# Patient Record
Sex: Female | Born: 1980 | Race: White | Hispanic: No | Marital: Married | Smoking: Never smoker
Health system: Southern US, Community
[De-identification: ages and names within clinical notes are randomized; demographics above are authoritative.]

## PROBLEM LIST (undated history)

## (undated) DIAGNOSIS — G43909 Migraine, unspecified, not intractable, without status migrainosus: Secondary | ICD-10-CM

## (undated) DIAGNOSIS — Z975 Presence of (intrauterine) contraceptive device: Secondary | ICD-10-CM

## (undated) DIAGNOSIS — Z9889 Other specified postprocedural states: Secondary | ICD-10-CM

## (undated) DIAGNOSIS — Z309 Encounter for contraceptive management, unspecified: Secondary | ICD-10-CM

## (undated) HISTORY — DX: Encounter for contraceptive management, unspecified: Z30.9

## (undated) HISTORY — PX: CHOLECYSTECTOMY: SHX55

## (undated) HISTORY — DX: Presence of (intrauterine) contraceptive device: Z97.5

---
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
## 2000-04-25 ENCOUNTER — Other Ambulatory Visit: Admission: RE | Admit: 2000-04-25 | Discharge: 2000-04-25 | Payer: Self-pay | Admitting: Gynecology

## 2001-07-14 ENCOUNTER — Other Ambulatory Visit: Admission: RE | Admit: 2001-07-14 | Discharge: 2001-07-14 | Payer: Self-pay | Admitting: Gynecology

## 2002-10-12 ENCOUNTER — Other Ambulatory Visit: Admission: RE | Admit: 2002-10-12 | Discharge: 2002-10-12 | Payer: Self-pay | Admitting: Gynecology

## 2003-01-22 ENCOUNTER — Inpatient Hospital Stay (HOSPITAL_COMMUNITY): Admission: AD | Admit: 2003-01-22 | Discharge: 2003-01-22 | Payer: Self-pay | Admitting: Gynecology

## 2003-04-05 ENCOUNTER — Inpatient Hospital Stay (HOSPITAL_COMMUNITY): Admission: AD | Admit: 2003-04-05 | Discharge: 2003-04-05 | Payer: Self-pay | Admitting: Gynecology

## 2003-05-27 ENCOUNTER — Inpatient Hospital Stay (HOSPITAL_COMMUNITY): Admission: AD | Admit: 2003-05-27 | Discharge: 2003-05-31 | Payer: Self-pay | Admitting: Gynecology

## 2003-06-01 ENCOUNTER — Encounter: Admission: RE | Admit: 2003-06-01 | Discharge: 2003-07-01 | Payer: Self-pay | Admitting: Gynecology

## 2003-07-02 ENCOUNTER — Encounter: Admission: RE | Admit: 2003-07-02 | Discharge: 2003-08-01 | Payer: Self-pay | Admitting: Gynecology

## 2003-07-15 ENCOUNTER — Other Ambulatory Visit: Admission: RE | Admit: 2003-07-15 | Discharge: 2003-07-15 | Payer: Self-pay | Admitting: Gynecology

## 2003-09-01 ENCOUNTER — Encounter: Admission: RE | Admit: 2003-09-01 | Discharge: 2003-10-01 | Payer: Self-pay | Admitting: Gynecology

## 2003-11-01 ENCOUNTER — Encounter: Admission: RE | Admit: 2003-11-01 | Discharge: 2003-12-01 | Payer: Self-pay | Admitting: Gynecology

## 2003-12-02 ENCOUNTER — Encounter: Admission: RE | Admit: 2003-12-02 | Discharge: 2004-01-01 | Payer: Self-pay | Admitting: Gynecology

## 2004-02-01 ENCOUNTER — Encounter: Admission: RE | Admit: 2004-02-01 | Discharge: 2004-03-02 | Payer: Self-pay | Admitting: Gynecology

## 2004-04-02 ENCOUNTER — Encounter: Admission: RE | Admit: 2004-04-02 | Discharge: 2004-05-01 | Payer: Self-pay | Admitting: Gynecology

## 2005-05-29 ENCOUNTER — Other Ambulatory Visit: Admission: RE | Admit: 2005-05-29 | Discharge: 2005-05-29 | Payer: Self-pay | Admitting: Gynecology

## 2005-10-03 ENCOUNTER — Inpatient Hospital Stay (HOSPITAL_COMMUNITY): Admission: AD | Admit: 2005-10-03 | Discharge: 2005-10-03 | Payer: Self-pay | Admitting: Gynecology

## 2005-11-26 ENCOUNTER — Inpatient Hospital Stay (HOSPITAL_COMMUNITY): Admission: AD | Admit: 2005-11-26 | Discharge: 2005-11-26 | Payer: Self-pay | Admitting: Gynecology

## 2005-12-12 ENCOUNTER — Inpatient Hospital Stay (HOSPITAL_COMMUNITY): Admission: RE | Admit: 2005-12-12 | Discharge: 2005-12-15 | Payer: Self-pay | Admitting: Gynecology

## 2006-01-30 ENCOUNTER — Other Ambulatory Visit: Admission: RE | Admit: 2006-01-30 | Discharge: 2006-01-30 | Payer: Self-pay | Admitting: Gynecology

## 2008-04-17 ENCOUNTER — Emergency Department (HOSPITAL_COMMUNITY): Admission: EM | Admit: 2008-04-17 | Discharge: 2008-04-17 | Payer: Self-pay | Admitting: Emergency Medicine

## 2008-04-18 ENCOUNTER — Ambulatory Visit (HOSPITAL_COMMUNITY): Admission: RE | Admit: 2008-04-18 | Discharge: 2008-04-18 | Payer: Self-pay | Admitting: Emergency Medicine

## 2009-04-08 ENCOUNTER — Ambulatory Visit (HOSPITAL_COMMUNITY): Admission: RE | Admit: 2009-04-08 | Discharge: 2009-04-08 | Payer: Self-pay | Admitting: General Surgery

## 2010-06-11 LAB — CBC
HCT: 39.3 % (ref 36.0–46.0)
Hemoglobin: 13.2 g/dL (ref 12.0–15.0)
MCHC: 33.6 g/dL (ref 30.0–36.0)
MCV: 90.1 fL (ref 78.0–100.0)
Platelets: 377 10*3/uL (ref 150–400)
RBC: 4.36 MIL/uL (ref 3.87–5.11)
RDW: 13.7 % (ref 11.5–15.5)
WBC: 9.8 10*3/uL (ref 4.0–10.5)

## 2010-06-11 LAB — HCG, QUANTITATIVE, PREGNANCY: hCG, Beta Chain, Quant, S: <2 m[IU]/mL (ref <5)

## 2010-06-11 LAB — BASIC METABOLIC PANEL
BUN: 18 mg/dL (ref 6–23)
CO2: 28 mEq/L (ref 19–32)
Calcium: 9.8 mg/dL (ref 8.4–10.5)
Chloride: 104 mEq/L (ref 96–112)
Creatinine, Ser: 0.62 mg/dL (ref 0.4–1.2)
GFR calc Af Amer: >60 mL/min (ref >60)
GFR calc non Af Amer: >60 mL/min (ref >60)
Glucose, Bld: 84 mg/dL (ref 70–99)
Potassium: 4 mEq/L (ref 3.5–5.1)
Sodium: 137 mEq/L (ref 135–145)

## 2010-07-10 LAB — CBC
HCT: 37.3 % (ref 36.0–46.0)
Hemoglobin: 12.3 g/dL (ref 12.0–15.0)
MCHC: 32.9 g/dL (ref 30.0–36.0)
MCV: 88 fL (ref 78.0–100.0)
Platelets: 290 10*3/uL (ref 150–400)
RBC: 4.24 MIL/uL (ref 3.87–5.11)
RDW: 13.4 % (ref 11.5–15.5)
WBC: 8.7 10*3/uL (ref 4.0–10.5)

## 2010-07-10 LAB — COMPREHENSIVE METABOLIC PANEL
ALT: 12 U/L (ref 0–35)
AST: 14 U/L (ref 0–37)
Albumin: 3.5 g/dL (ref 3.5–5.2)
Alkaline Phosphatase: 39 U/L (ref 39–117)
BUN: 10 mg/dL (ref 6–23)
CO2: 27 mEq/L (ref 19–32)
Calcium: 8.6 mg/dL (ref 8.4–10.5)
Chloride: 105 mEq/L (ref 96–112)
Creatinine, Ser: 0.6 mg/dL (ref 0.4–1.2)
GFR calc Af Amer: >60 mL/min (ref >60)
GFR calc non Af Amer: >60 mL/min (ref >60)
Glucose, Bld: 94 mg/dL (ref 70–99)
Potassium: 3.3 mEq/L — ABNORMAL LOW (ref 3.5–5.1)
Sodium: 140 mEq/L (ref 135–145)
Total Bilirubin: 0.2 mg/dL — ABNORMAL LOW (ref 0.3–1.2)
Total Protein: 6.3 g/dL (ref 6.0–8.3)

## 2010-07-10 LAB — URINALYSIS, ROUTINE W REFLEX MICROSCOPIC
Bilirubin Urine: NEGATIVE
Glucose, UA: NEGATIVE mg/dL
Hgb urine dipstick: NEGATIVE
Nitrite: NEGATIVE
Protein, ur: NEGATIVE mg/dL
Specific Gravity, Urine: 1.015 (ref 1.005–1.030)
Urobilinogen, UA: 0.2 mg/dL (ref 0.0–1.0)
pH: 7.5 (ref 5.0–8.0)

## 2010-07-10 LAB — DIFFERENTIAL
Basophils Absolute: 0 10*3/uL (ref 0.0–0.1)
Basophils Relative: 1 % (ref 0–1)
Eosinophils Absolute: 0.2 10*3/uL (ref 0.0–0.7)
Eosinophils Relative: 2 % (ref 0–5)
Lymphocytes Relative: 27 % (ref 12–46)
Lymphs Abs: 2.3 10*3/uL (ref 0.7–4.0)
Monocytes Absolute: 0.7 10*3/uL (ref 0.1–1.0)
Monocytes Relative: 9 % (ref 3–12)
Neutro Abs: 5.4 10*3/uL (ref 1.7–7.7)
Neutrophils Relative %: 62 % (ref 43–77)

## 2010-07-10 LAB — AMYLASE: Amylase: 44 U/L (ref 27–131)

## 2010-07-10 LAB — LIPASE, BLOOD: Lipase: 24 U/L (ref 11–59)

## 2010-07-10 LAB — PREGNANCY, URINE: Preg Test, Ur: NEGATIVE

## 2010-08-11 NOTE — Discharge Summary (Signed)
NAME:  Carrie Arias, Carrie Arias                           ACCOUNT NO.:  [id number]   MEDICAL RECORD NO.:  [id number]                   PATIENT TYPE:  INP   LOCATION:  9127                                 FACILITY:  [facility]   PHYSICIAN:  Juan H. Lily Peer, M.D.             DATE OF BIRTH:  May 10, 1980   DATE OF ADMISSION:  05/27/2003  DATE OF DISCHARGE:  05/31/2003                                 DISCHARGE SUMMARY   DISCHARGE DIAGNOSES:  1. Intrauterine pregnancy 41+ weeks, delivered.  2. Arrest of first stage of labor, suspect cephalopelvic disproportion.  3. Status post primary lower uterine segment transverse cesarean section by     Dr. Reynaldo Minium on May 28, 2003.   HISTORY:  This is a 22-years-of-age female gravida 1 para 0 whose prenatal  course was uncomplicated.   HOSPITAL COURSE:  On May 27, 2003 the patient was admitted at 41+ weeks for  induction, was given Cervidil, and in the a.m. of May 28, 2003 was begun on  Pitocin.  The patient was diagnosed with arrest of the first stage of labor,  suspect cephalopelvic disproportion, and therefore underwent a primary lower  uterine segment transverse cesarean section by Dr. Reynaldo Minium on May 28, 2003.  Underwent delivery of a female, Apgars of 9 and 9, weight of 8  pounds 9 ounces.  There was noted to be no complications.  Postpartum the  patient remained afebrile, voiding, in stable condition, and she was  discharged to home on May 31, 2003 and given [facility]  postpartum instructions and postpartum booklet.   ACCESSORY CLINICAL FINDINGS/LABORATORY DATA:  The patient is A positive,  rubella immune.  On May 29, 2003 hemoglobin was 9.5.   DISPOSITION:  The patient is discharged to home, informed to return to the  office in 6 weeks, if had any problem prior to that time to be seen in the  office, given a prescription for Percocet p.r.n. pain.     Susa Loffler, P.A.                    Juan H. Lily Peer, M.D.    TSG/MEDQ  D:  07/12/2003  T:  07/13/2003  Job:  [id number]

## 2010-08-11 NOTE — Op Note (Signed)
[name], Carrie Arias                 ACCOUNT NO.:  [id number]   MEDICAL RECORD NO.:  [id number]          PATIENT TYPE:  INP   LOCATION:  9115                          [facility]ACILITY:  [facility]   PHYSICIAN:  Juan H. Lily Peer, M.D.DATE OF BIRTH:  Apr 26, 1980   DATE OF PROCEDURE:  12/12/2005  DATE OF DISCHARGE:                                 OPERATIVE REPORT   SURGEON:  Juan H. Lily Peer, M.D.   FIRST ASSISTANT:  Ivor Costa. Farrel Gobble, M.D.   INDICATION FOR OPERATION:  A 30 year old gravida 2, para 1, with history of  prior cesarean section requesting elective repeat.  She is currently 38-5/[redacted]  weeks gestation.   PREOPERATIVE DIAGNOSES:  1. Term intrauterine pregnancy.  2. Previous lower uterine segment transverse cesarean section, requesting      elective repeat.   POSTOPERATIVE DIAGNOSES:  1. Term intrauterine pregnancy.  2. Previous lower uterine segment transverse cesarean section, requesting      elective repeat.   ANESTHESIA:  Spinal.   PROCEDURE PERFORMED:  Repeat lower uterine segment transverse cesarean  section.   FINDINGS:  Viable female infant, Apgars are 9 and 9, with a weight of 7  pounds, 7 ounces.  Normal maternal pelvic anatomy.   DESCRIPTION OF OPERATION:  After the patient was adequately counseled she  underwent a successful spinal anesthesia.  The abdomen was prepped and  draped in the usual sterile fashion.  A Foley catheter was inserted in  effort to monitor urinary output.  Marcaine 0.25% had been infiltrated in  the planned Pfannenstiel incision site.  Incision was carried out from the  skin, down to the subcutaneous tissue, down through the rectus fascia.  __________ nick was made, the fascia was incised in a transverse fashion.  The peritoneal cavity was entered cautiously.  The bladder flap was  established.  The lower uterine segment was incised in a transverse fashion.  Clear amniotic fluid was present.  Newborn's head was delivered after a  manual reduction of  the nuchal cord.  The nasopharyngeal air was bulb  suctioned.  The newborn was delivered, the cord was doubly clamped and  excised, shown to the parents and passed off to the neonatologist who  carried out the above-mentioned parameters.  After cord blood was obtained,  the placenta and cord was passed off the operative field.  The patient was  donating her cord blood to the __________ Blood Bank.  Ancef 1 g was  administered intravenously.  The uterus exteriorized.  The intrauterine  cavity was swept clear of remaining products of conception.  The lower  uterine segment and transverse incision was closed in a locking stitch  manner, first with a #0 Vicryl suture followed by a second layer in  imbricating fashion with a similar suture material.  There was normal  maternal pelvic anatomy.  The uterus was placed back in the pelvic cavity.  The visceral peritoneum was not reapproximated but the rectus fascia was  closed with a running stitch of #0 Vicryl suture.  Subcutaneous bleeders  were Bovie cauterized.  The skin was  reapproximated with skin clips  followed by placing Xeroform gauze and a 4 x  8 dressing.  Patient was transferred to the recovery room with stable vital  signs.  Blood loss was reported at 700 mL.  IV fluids 3 liters of lactated  Ringer's and urine output was 200 mL and clear.      Juan H. Lily Peer, M.D.  Electronically Signed     JHF/MEDQ  D:  12/12/2005  T:  12/13/2005  Job:  [id number]

## 2010-08-11 NOTE — Discharge Summary (Signed)
[name], Carrie Arias                 ACCOUNT NO.:  [id number]   MEDICAL RECORD NO.:  [id number]          PATIENT TYPE:  INP   LOCATION:  9115                          [facility]ILITY:  [facility]   PHYSICIAN:  Ivor Costa. Farrel Gobble, M.D. DATE OF BIRTH:  Aug 21, 1980   DATE OF ADMISSION:  12/12/2005  DATE OF DISCHARGE:  12/15/2005                                 DISCHARGE SUMMARY   PRINCIPAL DIAGNOSIS:  Term pregnancy, prior cesarean section.   PRINCIPAL PROCEDURE:  Elective repeat cesarean section.   HOSPITAL COURSE:  Refer to the dictated H&P. The patient presented on the  morning of September 19 and underwent elective repeat cesarean section done  by Dr. Lily Peer under spinal anesthesia for delivery of a viable female  with Apgar's of 9 and 9, birth weight 7.7, normal uterus, tubes and ovaries.  The patient was transferred to the recovery room and the postpartum floor in  due fashion. Her postpartum course was unremarkable. She remained afebrile  and her vitals stable throughout. She was tolerating a regular diet and oral  analgesics at the time of discharge. Her postoperative exam, her abdomen was  soft nontender. Her uterus was below the umbilicus and nontender. Her  incision was closed with staples and was poorly approximated in the mid  portion without any signs or symptoms of infection. No induration or warmth.  The extremities were nontender.   ASSESSMENT:  Status post elective repeat cesarean section. She was  discharged home in stable condition. She was given a prescription for  Percocet 5 one to two q.4h p.r.n. pain, #40. She will mix that with over-the-  counter Motrin. She will return back to the office on Monday for staple  removal.      Ivor Costa. Farrel Gobble, M.D.  Electronically Signed     THL/MEDQ  D:  12/15/2005  T:  12/18/2005  Job:  [id number]

## 2010-08-11 NOTE — Op Note (Signed)
NAME:  Carrie Arias, Carrie Arias                           ACCOUNT NO.:  [id number]   MEDICAL RECORD NO.:  [id number]                   PATIENT TYPE:  INP   LOCATION:  9127                                 [facility]   PHYSICIAN:  Juan H. Lily Peer, M.D.             DATE OF BIRTH:  07/15/1980   DATE OF PROCEDURE:  05/28/2003  DATE OF DISCHARGE:                                 OPERATIVE REPORT   PREOPERATIVE DIAGNOSES:  1. Term intrauterine pregnancy.  2. Arrest of first stage of labor, suspect cephalopelvic disproportion.   POSTOPERATIVE DIAGNOSES:  1. Term intrauterine pregnancy.  2. Arrest of first stage of labor, suspect cephalopelvic disproportion.   ANESTHESIA:  Epidural.   SURGEON:  Juan H. Lily Peer, M.D.   PROCEDURE PERFORMED:  Primary lower uterine segment transverse cesarean  section.   INDICATION FOR OPERATION:  A 30 year old gravida 1, para 0, at 41-1/2 weeks'  estimated gestational age, with arrest of first stage of labor.  Suspect  cephalopelvic disproportion.   FINDINGS:  A viable female infant, Apgars of 9 and 9, with an arterial cord  pH of 7.31 and a weight of 8 pounds 9 ounces.  Normal clear amniotic fluid.  Normal maternal pelvic anatomy.   DESCRIPTION OF OPERATION:  After the patient was adequately counseled, she  was taken to the operating room, where she underwent redosing of her  epidural.  The abdomen was prepped and draped in the usual sterile fashion.  A Foley catheter was in place.  A Pfannenstiel skin incision was made 2 cm  above the symphysis pubis.  The incision was carried down from the skin down  through the subcutaneous tissue to the fascia, whereby a midline nick was  made.  The fascia was incised in a transverse fashion.  The peritoneal  cavity was entered cautiously and the bladder flap was established.  The  lower uterine segment was incised in a transverse fashion, clear amniotic  fluid was present.  The newborn was delivered without any  complication, and  the nasopharyngeal area was bulb-suctioned.  The cord was doubly clamped and  excised.  The newborn gave an immediate cry, was shown to the parents and  passed off to the pediatricians who were in attendance, who gave the above-  mentioned parameters.  After cord blood was obtained, the placenta was  delivered from the intrauterine cavity.  The uterus was exteriorized and the  intrauterine cavity was swept clean of remaining products of conception.  The lower uterine transverse incision was closed with a running locking  stitch of 0 Vicryl suture.  The uterus was placed back in the pelvic cavity.  The pelvic cavity was copiously irrigated with normal saline solution.  After ascertaining adequate hemostasis, the fascia was closed.  The visceral  peritoneum was not closed.  The rectus fascia was closed with a running  stitch of 0 Vicryl suture.  Subcutaneous bleeders were  Bovie cauterized.  The skin was reapproximated with skin clips,  followed by placement of Xeroform gauze and 4 x 8 dressing.  The patient was  transferred to the recovery room with stable vital signs.  Blood loss from  the procedure was 700 mL.  Urine output was 125 mL, and IV fluids pending at  the time of this dictation.                                               Juan H. Lily Peer, M.D.    JHF/MEDQ  D:  05/28/2003  T:  05/29/2003  Job:  [id number]

## 2010-08-11 NOTE — H&P (Signed)
Carrie Arias, Carrie Arias                 ACCOUNT NO.:  [id number]   MEDICAL RECORD NO.:  [id number]          PATIENT TYPE:  INP   LOCATION:  NA                            [facility]  [facility]   PHYSICIAN:  Juan H. Lily Peer, M.D.DATE OF BIRTH:  16-Nov-1980   DATE OF ADMISSION:  DATE OF DISCHARGE:                                HISTORY & PHYSICAL   CHIEF COMPLAINT:  1. Term intrauterine pregnancy at 38-5/7-weeks' gestation.  2. Prior cesarean section, requesting elective repeat.  3. Varicella infection, second trimester.   HISTORY:  The patient is a 30 year old gravida 2, para 1 with last menstrual  period March 16, 2005. Estimated date of confinement is December 20, 2005. The patient will be 38-5/7-weeks' gestation at time of requested  elective repeat cesarean section. Patient with prior cesarean section  secondary to arrest of first stage of labor. The patient in this pregnancy  had declined first trimester screening and cystic fibrosis screening and  alpha fetoprotein. The patient had requested elective repeat cesarean  section. She had been offered a trial of labor. At approximately 29-1/2-  weeks' gestation, the patient had developed a rash on her chest and anterior  abdominal wall. Her daughter had been diagnosed with chicken pox, and within  a 96-hour period, the patient had received at [facility] intravenous  immunoglobulin, and subsequently, the symptoms had resolved. The patient was  followed with antepartum testing consisting of ultrasounds with good  interval growth and reassuring fetal heart tracing. Her group B strep  culture was negative.   PAST MEDICAL HISTORY:  1. The patient had prior cesarean section secondary to arrest of first      stage of labor.  2. Varicella infection second trimester, received IVIG on October 03, 2005.   The patient denies any allergies. On prenatal vitamins.   REVIEW OF SYSTEMS:  See hospital form.   PHYSICAL EXAMINATION:  VITAL SIGNS:   Blood pressure 122/78. Urine was  negative for protein and glucose. Weight 187 pounds.  HEENT:  Unremarkable.  NECK:  Supple. Trachea midline. No carotid bruits. No thyromegaly.  LUNGS:  Clear to auscultation without rhonchi or wheezes.  HEART:  Regular rate and rhythm. No murmurs or gallops.  BREASTS:  Not done.  ABDOMEN:  Gravida uterus. Vertex presentation by __________ maneuver.  Pfannenstiel scar was evident. Cervix was long, closed and posterior.  EXTREMITIES:  DTRs 1+. Negative clonus.   PRENATAL LABORATORY DATA:  A positive blood type. Negative antibody screen.  VDRL nonreactive. Rubella immune. Hepatitis B surface antigen and HIV were  negative. Diabetes screen was normal. The patient declined cystic fibrosis  screening, declined alpha fetoprotein testing and declined first trimester  screening, and GBS culture was negative.   ASSESSMENT:  A 30 year old gravida 2, para 1 at 38-5/7-weeks' gestation at  time of requested elective repeat cesarean section. Risks, benefits, pros  and cons of the operation were discussed with the patient. All questions  were answered and will follow accordingly.   PLAN:  The patient is scheduled for a repeat cesarean section Wednesday,  September 19 at 8:30  a.m. at [facility]. Please have history and  physical available.      Juan H. Lily Peer, M.D.  Electronically Signed     JHF/MEDQ  D:  12/11/2005  T:  12/11/2005  Job:  [id number]

## 2010-08-11 NOTE — H&P (Signed)
NAMEDARWIN, GUASTELLA                 ACCOUNT NO.:  [id number]   MEDICAL RECORD NO.:  [id number]          PATIENT TYPE:  AMB   LOCATION:  DAY                           FACILITY:  [facility]   PHYSICIAN:  Tilford Pillar, MD      DATE OF BIRTH:  06/07/1980   DATE OF ADMISSION:  DATE OF DISCHARGE:  LH                              HISTORY & PHYSICAL   CHIEF COMPLAINT:  Gallstones.   HISTORY OF PRESENT ILLNESS:  The patient is a 30 year old female who  presents to my office on referral from [facility] with a  history of right-sided abdominal pain.  She states that the pain does  radiate to the right-sided flank as well as to her right back.  She does  have occasional nausea, but denies any emesis.  She has had no change in  her bowel movements.  No melena or hematochezia.  She states the pain  has been intermittent over the last 2 years, but has increased over the  last several weeks.  She has had no fever or chills.  No history of  jaundice.  She does state that she has noted some change with diet and  did state her last episode did seem to get worse after having a portion  of lasagna.   PAST MEDICAL HISTORY:  None.   PAST SURGICAL HISTORY:  Cyst in the left breast and a cesarean section.   MEDICATIONS:  None.   ALLERGIES:  No known drug allergies.   SOCIAL HISTORY:  No tobacco, no alcohol use.  Occupation is account.  She has had 2 prior pregnancies.   PERTINENT FAMILY HISTORY:  She does have a grandmother, who has had a  history of biliary disease.  She denies any Native American ancestry.   REVIEW OF SYSTEMS:  CONSTITUTIONAL:  Unremarkable.  EYES:  Unremarkable.  EARS, NOSE, and THROAT:  Unremarkable.  RESPIRATORY:  Unremarkable.  CARDIOVASCULAR:  Unremarkable.  GASTROINTESTINAL:  Abdominal pain as per  HPI.  GENITOURINARY:  Unremarkable.  MUSCULOSKELETAL:  Arthralgias of  the back.  SKIN:  Unremarkable.  ENDOCRINE:  Unremarkable.  NEURO:  Unremarkable.   PHYSICAL  EXAMINATION:  GENERAL:  The patient is mildly obese.  She is  calm.  She denied any acute distress.  She is alert and oriented x3.  HEENT:  Scalp, no deformities.  No masses.  Eyes, pupils equal, round,  and reactive to light.  Extraocular movements are intact.  No scleral  icterus or conjunctival pallor is noted.  Oral mucosa is pink.  Normal  occlusion.  NECK:  Trachea is midline.  No thyroid nodularity.  No cervical  lymphadenopathy.  PULMONARY:  Unlabored respiration.  No wheezes.  No crackles.  She is  clear to auscultation bilaterally.  CARDIOVASCULAR:  Regular rate and rhythm.  No murmurs or gallops.  She  has 2+ radial and dorsalis pedis pulses bilaterally.  ABDOMEN:  Positive bowel sounds.  Abdomen is soft.  She does have some  tenderness in the right upper quadrant.  She does have positive Murphy  sign.  No hernias.  No masses are apparent.  SKIN:  Warm and dry.   PERTINENT LABORATORY AND RADIOGRAPHIC STUDIES:  Right upper quadrant  ultrasound demonstrates multiple gallstones.  She does not demonstrate  any ductal dilatation.  Her liver panel is within normal limits.   ASSESSMENT AND PLAN:  Cholelithiasis.  At this time, I had a long  discussion with the patient regarding the risks, benefits, and  alternatives of the laparoscopic possible open cholecystectomy  including, but not limited to the risk of bleeding, infection, bile  leak, small-bowel injury, common bile duct injury, as well as  possibility of intraoperative cardiac and pulmonary events.  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  We will plan to  schedule this at her earliest convenience.  I did discuss in the interim  the patient should avoid any fatty greasy foods and to call us should  her symptomatology worsen.      Tilford Pillar, MD   Electronically Signed     BZ/MEDQ  D:  06/30/2008  T:  07/01/2008  Job:  [id number]   cc:   Short-Stay Surgery   [facility]

## 2012-12-01 ENCOUNTER — Telehealth: Payer: Self-pay | Admitting: Family Medicine

## 2012-12-01 MED ORDER — BENZONATATE 100 MG PO CAPS
100.0000 mg | ORAL_CAPSULE | Freq: Three times a day (TID) | ORAL | Status: DC | PRN
Start: 2012-12-01 — End: 2013-04-02

## 2012-12-01 NOTE — Telephone Encounter (Signed)
More than likely a virus , if Sx beyopnd 7 days or fevers/ SOB/severe Ha etc then NTBS, otherwise let run its course

## 2012-12-01 NOTE — Telephone Encounter (Signed)
Sent in medication to [facility]. Patient was notified.

## 2012-12-01 NOTE — Telephone Encounter (Signed)
Tessalon 100 mg one tid prn , #21

## 2012-12-01 NOTE — Telephone Encounter (Signed)
Patient has a cough, did have a fever and cold chills about 3 or 4 days ago but that is now gone. She is wondering if we can call her something in due to her not having insurance. I explained to her that we usually require an office visit for that, but she still wanted me to send back a message to try.  [facility]

## 2012-12-01 NOTE — Telephone Encounter (Signed)
Notified patient more than likely a virus , if Sx beyopnd 7 days or fevers/ SOB/severe Ha etc then NTBS, otherwise let run its course. Patient verbalized understanding. Patient wants to know if something can be called in for her cough?

## 2013-04-02 ENCOUNTER — Ambulatory Visit (INDEPENDENT_AMBULATORY_CARE_PROVIDER_SITE_OTHER): Payer: BC Managed Care – PPO | Admitting: Family Medicine

## 2013-04-02 ENCOUNTER — Encounter: Payer: Self-pay | Admitting: Family Medicine

## 2013-04-02 VITALS — BP 118/64 | Temp 101.5°F | Ht 63.0 in | Wt 185.0 lb

## 2013-04-02 DIAGNOSIS — J111 Influenza due to unidentified influenza virus with other respiratory manifestations: Secondary | ICD-10-CM

## 2013-04-02 MED ORDER — OSELTAMIVIR PHOSPHATE 75 MG PO CAPS: 75.0000 mg | ORAL_CAPSULE | Freq: Two times a day (BID) | ORAL | Status: DC

## 2013-04-02 NOTE — Progress Notes (Signed)
   Subjective:    Patient ID: Carrie Arias, female    DOB: 08/16/1980, 33 y.o.   MRN: [id number]  Cough This is a new problem. The current episode started in the past 7 days. Associated symptoms include ear pain, a fever, headaches, myalgias, nasal congestion, a sore throat and shortness of breath.    tue started , eve develoed fever achey nes  Painful ear eyes hurt  Burning nasal disch  Bad cough   achey i the gum, other sick at work  Using nyquil and ibuprofen  dayquil  Felt temp up and felt bad   Review of Systems  Constitutional: Positive for fever.  HENT: Positive for ear pain and sore throat.   Respiratory: Positive for cough and shortness of breath.   Musculoskeletal: Positive for myalgias.  Neurological: Positive for headaches.       Objective:   Physical Exam  Alert moderate malaise. Temperature 101.5 Fahrenheit. H&T frontal congestion and fullness.  Lungs clear with intermittent deep cough. Heart rare rhythm. Hydration good      Assessment & Plan:  Impression probable flu. Sudden onset. Classic symptoms. Plan warning signs discussed. Tamiflu twice a day 5 days. Symptomatic care discussed. WSL

## 2013-10-16 ENCOUNTER — Emergency Department (HOSPITAL_COMMUNITY): Payer: BC Managed Care – PPO

## 2013-10-16 ENCOUNTER — Emergency Department (HOSPITAL_COMMUNITY)
Admission: EM | Admit: 2013-10-16 | Discharge: 2013-10-16 | Disposition: A | Discharge status: Home / Self Care | Payer: BC Managed Care – PPO | Attending: Emergency Medicine | Admitting: Emergency Medicine

## 2013-10-16 ENCOUNTER — Encounter (HOSPITAL_COMMUNITY): Payer: Self-pay | Admitting: Emergency Medicine

## 2013-10-16 DIAGNOSIS — Z792 Long term (current) use of antibiotics: Secondary | ICD-10-CM | POA: Insufficient documentation

## 2013-10-16 DIAGNOSIS — L03211 Cellulitis of face: Secondary | ICD-10-CM

## 2013-10-16 DIAGNOSIS — K029 Dental caries, unspecified: Secondary | ICD-10-CM | POA: Insufficient documentation

## 2013-10-16 DIAGNOSIS — L0201 Cutaneous abscess of face: Secondary | ICD-10-CM | POA: Insufficient documentation

## 2013-10-16 DIAGNOSIS — R221 Localized swelling, mass and lump, neck: Secondary | ICD-10-CM

## 2013-10-16 DIAGNOSIS — Z3202 Encounter for pregnancy test, result negative: Secondary | ICD-10-CM | POA: Insufficient documentation

## 2013-10-16 DIAGNOSIS — R220 Localized swelling, mass and lump, head: Secondary | ICD-10-CM | POA: Insufficient documentation

## 2013-10-16 LAB — POC URINE PREG, ED: Preg Test, Ur: NEGATIVE

## 2013-10-16 MED ORDER — MORPHINE SULFATE 4 MG/ML IJ SOLN
4.0000 mg | Freq: Once | INTRAMUSCULAR | Status: AC
  Administered 2013-10-16: 4 mg via INTRAVENOUS
  Filled 2013-10-16: qty 1

## 2013-10-16 MED ORDER — HYDROCODONE-ACETAMINOPHEN 5-325 MG PO TABS: 1.0000 | ORAL_TABLET | Freq: Four times a day (QID) | ORAL | Status: DC | PRN

## 2013-10-16 MED ORDER — IOHEXOL 300 MG/ML  SOLN
100.0000 mL | Freq: Once | INTRAMUSCULAR | Status: AC | PRN
  Administered 2013-10-16: 100 mL via INTRAVENOUS

## 2013-10-16 NOTE — ED Notes (Signed)
Dr. Gordy LevanWalton, resident, at bedside

## 2013-10-16 NOTE — Discharge Instructions (Signed)

## 2013-10-16 NOTE — ED Provider Notes (Signed)
CSN: [id number]     Arrival date & time 10/16/13  1459 History   First MD Initiated Contact with Patient 10/16/13 1622     Chief Complaint  Patient presents with  . Facial Swelling     (Consider location/radiation/quality/duration/timing/severity/associated sxs/prior Treatment) Patient is a 33 y.o. female presenting with rash.  Rash Location:  Face Quality: redness and swelling   Severity:  Mild Onset quality:  Gradual Duration:  4 days Timing:  Constant Progression:  Worsening Chronicity:  New Context comment:  Dental procedure Relieved by:  Nothing Associated symptoms: no abdominal pain, no diarrhea, no fever, no headaches, no nausea, no shortness of breath, no sore throat and not vomiting     History reviewed. No pertinent past medical history. Past Surgical History  Procedure Laterality Date  . Cholecystectomy     History reviewed. No pertinent family history. History  Substance Use Topics  . Smoking status: Never Smoker   . Smokeless tobacco: Not on file  . Alcohol Use: No   OB History   Grav Para Term Preterm Abortions TAB SAB Ect Mult Living                 Review of Systems  Constitutional: Negative for fever and chills.  HENT: Negative for sore throat.   Eyes: Negative for pain.  Respiratory: Negative for cough and shortness of breath.   Cardiovascular: Negative for chest pain.  Gastrointestinal: Negative for nausea, vomiting, abdominal pain and diarrhea.  Genitourinary: Negative for dysuria.  Musculoskeletal: Negative for back pain.  Skin: Positive for rash.  Neurological: Negative for numbness and headaches.      Allergies  Review of patient's allergies indicates no known allergies.  Home Medications   Prior to Admission medications   Medication Sig Start Date End Date Taking? Authorizing Provider  clindamycin (CLEOCIN) 300 MG capsule Take 300 mg by mouth 4 (four) times daily. Started course of medication on 10-13-13 take until all gone (40  capsules in bottle)   Yes Historical Provider, MD  traMADol-acetaminophen (ULTRACET) 37.5-325 MG per tablet Take 1 tablet by mouth every 6 (six) hours as needed for moderate pain.   Yes Historical Provider, MD  HYDROcodone-acetaminophen (NORCO/VICODIN) 5-325 MG per tablet Take 1 tablet by mouth every 6 (six) hours as needed for severe pain. 10/16/13   Imagene Sheller, MD   BP 128/72  Pulse 79  Temp(Src) 98.5 F (36.9 C) (Oral)  Resp 18  SpO2 95% Physical Exam  Constitutional: She is oriented to person, place, and time. She appears well-developed and well-nourished. No distress.  HENT:  Head: Normocephalic and atraumatic.  Mouth/Throat: Dental caries present.  Swelling / redness to L cheek / lips.   Eyes: Pupils are equal, round, and reactive to light. Right eye exhibits no discharge. Left eye exhibits no discharge.  Neck: Normal range of motion.  Cardiovascular: Normal rate, regular rhythm and normal heart sounds.   Pulmonary/Chest: Effort normal and breath sounds normal.  Abdominal: Soft. She exhibits no distension. There is no tenderness.  Musculoskeletal: Normal range of motion.  Neurological: She is alert and oriented to person, place, and time.  Skin: Skin is warm. She is not diaphoretic.    ED Course  Procedures (including critical care time) Labs Review Labs Reviewed  POC URINE PREG, ED    Imaging Review Ct Maxillofacial W/cm  10/16/2013   CLINICAL DATA:  Recent root canal and significant left facial swelling. The history recorded by the technologist incorrect. Then root canals on the  left.  EXAM: CT MAXILLOFACIAL WITH CONTRAST  TECHNIQUE: Multidetector CT imaging of the maxillofacial structures was performed with intravenous contrast. Multiplanar CT image reconstructions were also generated. A small metallic BB was placed on the right temple in order to reliably differentiate right from left.  CONTRAST:  100mL OMNIPAQUE IOHEXOL 300 MG/ML  SOLN  COMPARISON:  None.  FINDINGS:  There is edema and inflammation in the left subcutaneous tissues of the jaw. Streak artifact from the dental fillings markedly degrades assessment adjacent to the upper teeth. There is no gross facial abscess evident. Mild left-sided lymphadenopathy is noted. The visualized vascular at anatomy opacifies normally and symmetrically.  IMPRESSION: Edema/ inflammation in the soft tissues of the left cheek and jaw without an identifiable underlying abscess. Imaging features are compatible with cellulitis.   Electronically Signed   By: Kennith CenterEric  Mansell M.D.   On: 10/16/2013 19:52     EKG Interpretation None      MDM   Final diagnoses:  Cellulitis of face   33 year old female with a history of prior cholecystectomy presents today after a recent root canal 4 days ago with significant facial swelling and tenderness. Patient was seen by her dentist and prescribed clindamycin vessel been having some worsening pain and swelling.  Upon arrival the patient appears in no acute distress. She is hemodynamically stable and afebrile. She is not complaining of any nausea, vomiting. Patient does have an apparent possible cellulitis. Given the patient's signs and symptoms, there is concern for possible abscess. Plan is to obtain a CT scan.  CT scan demonstrates mild soft tissue swelling consistent with cellulitis but no evidence of an abscess. Patient is on clindamycin which is appropriate for the patient's condition this time. We'll recommend followup with her dentist. Strict return precautions were given. Patient discharged in stable condition. Patient seen and evaluated by myself and by the attending Dr. Judd Lienelo.     Imagene ShellerSteve Jeremaine Maraj, MD 10/17/13 [phone]

## 2013-10-16 NOTE — ED Notes (Signed)
Per pt recent root canal and since has had significant facial swelling. Pt was given abx by her dentist and not better. Swelling increased

## 2013-10-17 NOTE — ED Provider Notes (Signed)
I saw and evaluated the patient, reviewed the resident's note and I agree with the findings and plan. Patient is a 33 year old female who presents with complaints of left face swelling. She apparently had a root canal performed one week ago and the swelling has increased since that time. She was started on clindamycin 2 days ago, however the swelling persists. She denies any fevers or chills. She denies any difficulty breathing or swallowing.  On exam, vitals are stable and the patient is afebrile. There is swelling to the left cheek. Neck is supple. There is no cervical adenopathy. There is no swelling or crepitus to the neck that would suggest Ludwig's angina. Heart is regular rate and rhythm and lungs are clear.  CT scan was obtained and reveals no evidence for abscess, only facial cellulitis. Her pain was treated and I feel as though she is appropriate for discharge with followup with her dentist. She is to continue her clindamycin and return to the ER if she develops any new or concerning symptoms.      Geoffery Lyonsouglas Akshitha Culmer, MD 10/17/13 [id number]

## 2014-01-26 ENCOUNTER — Ambulatory Visit (INDEPENDENT_AMBULATORY_CARE_PROVIDER_SITE_OTHER): Payer: BC Managed Care – PPO | Admitting: Family Medicine

## 2014-01-26 ENCOUNTER — Other Ambulatory Visit: Payer: Self-pay

## 2014-01-26 ENCOUNTER — Encounter: Payer: Self-pay | Admitting: Family Medicine

## 2014-01-26 VITALS — Ht 63.0 in | Wt 187.8 lb

## 2014-01-26 DIAGNOSIS — L989 Disorder of the skin and subcutaneous tissue, unspecified: Secondary | ICD-10-CM

## 2014-01-26 NOTE — Progress Notes (Signed)
   Subjective:    Patient ID: Carrie Arias, female    DOB: 03/08/1981, 33 y.o.   MRN: [id number]  HPI Patient arrives to have a lesionremoved from under her left eye.  Patient has a hypertrophic lesion which has been growing under her left eyelid for the past year and a half.   Review of Systems No headache no chest pain no back pain    Objective:   Physical Exam  Alert vital signs stable lungs clear heart regular in rhythm. Left lower eyelid keratotic hornlike lesion   Procedure note patient was prepped draped anesthetized lesion was removed with shave biopsy technique. Stasis performed with silver nitrate stick.  Tissue sent for pathology analysis     Assessment & Plan:  Impression shave biopsy of probable benign lesion plan await results. Wound care discussed.

## 2014-07-07 ENCOUNTER — Ambulatory Visit (INDEPENDENT_AMBULATORY_CARE_PROVIDER_SITE_OTHER): Payer: 59 | Admitting: Adult Health

## 2014-07-07 ENCOUNTER — Encounter: Payer: Self-pay | Admitting: Adult Health

## 2014-07-07 ENCOUNTER — Other Ambulatory Visit (HOSPITAL_COMMUNITY)
Admission: RE | Admit: 2014-07-07 | Discharge: 2014-07-07 | Disposition: A | Discharge status: Home / Self Care | Payer: 59 | Source: Ambulatory Visit | Attending: Adult Health | Admitting: Adult Health

## 2014-07-07 VITALS — BP 130/82 | HR 84 | Ht 63.25 in | Wt 189.5 lb

## 2014-07-07 DIAGNOSIS — Z975 Presence of (intrauterine) contraceptive device: Secondary | ICD-10-CM

## 2014-07-07 DIAGNOSIS — Z01419 Encounter for gynecological examination (general) (routine) without abnormal findings: Secondary | ICD-10-CM | POA: Diagnosis not present

## 2014-07-07 DIAGNOSIS — Z1151 Encounter for screening for human papillomavirus (HPV): Secondary | ICD-10-CM | POA: Insufficient documentation

## 2014-07-07 DIAGNOSIS — Z3049 Encounter for surveillance of other contraceptives: Secondary | ICD-10-CM

## 2014-07-07 DIAGNOSIS — Z309 Encounter for contraceptive management, unspecified: Secondary | ICD-10-CM

## 2014-07-07 DIAGNOSIS — N898 Other specified noninflammatory disorders of vagina: Secondary | ICD-10-CM | POA: Insufficient documentation

## 2014-07-07 HISTORY — DX: Encounter for contraceptive management, unspecified: Z30.9

## 2014-07-07 HISTORY — DX: Presence of (intrauterine) contraceptive device: Z97.5

## 2014-07-07 MED ORDER — MISOPROSTOL 200 MCG PO TABS: ORAL_TABLET | ORAL | Status: DC

## 2014-07-07 MED ORDER — FLUCONAZOLE 150 MG PO TABS: ORAL_TABLET | ORAL | Status: DC

## 2014-07-07 NOTE — Patient Instructions (Addendum)
[facility]  Take cytotec 400 mcg in am of 4/20 and return that pm for IUD removal and reinsertion Physical in 1 year Mammogram at 38 Levonorgestrel intrauterine device (IUD) What is this medicine? LEVONORGESTREL IUD (LEE voe nor jes trel) is a contraceptive (birth control) device. The device is placed inside the uterus by a healthcare professional. It is used to prevent pregnancy and can also be used to treat heavy bleeding that occurs during your period. Depending on the device, it can be used for 3 to 5 years. This medicine may be used for other purposes; ask your health care provider or pharmacist if you have questions. COMMON BRAND NAME(S): Elveria RoyalsLILETTA, Mirena, Skyla What should I tell my health care provider before I take this medicine? They need to know if you have any of these conditions: -abnormal Pap smear -cancer of the breast, uterus, or cervix -diabetes -endometritis -genital or pelvic infection now or in the past -have more than one sexual partner or your partner has more than one partner -heart disease -history of an ectopic or tubal pregnancy -immune system problems -IUD in place -liver disease or tumor -problems with blood clots or take blood-thinners -use intravenous drugs -uterus of unusual shape -vaginal bleeding that has not been explained -an unusual or allergic reaction to levonorgestrel, other hormones, silicone, or polyethylene, medicines, foods, dyes, or preservatives -pregnant or trying to get pregnant -breast-feeding How should I use this medicine? This device is placed inside the uterus by a health care professional. Talk to your pediatrician regarding the use of this medicine in children. Special care may be needed. Overdosage: If you think you have taken too much of this medicine contact a poison control center or emergency room at once. NOTE: This medicine is only for you. Do not share this medicine with others. What if I miss a dose? This does  not apply. What may interact with this medicine? Do not take this medicine with any of the following medications: -amprenavir -bosentan -fosamprenavir This medicine may also interact with the following medications: -aprepitant -barbiturate medicines for inducing sleep or treating seizures -bexarotene -griseofulvin -medicines to treat seizures like carbamazepine, ethotoin, felbamate, oxcarbazepine, phenytoin, topiramate -modafinil -pioglitazone -rifabutin -rifampin -rifapentine -some medicines to treat HIV infection like atazanavir, indinavir, lopinavir, nelfinavir, tipranavir, ritonavir -St. John's wort -warfarin This list may not describe all possible interactions. Give your health care provider a list of all the medicines, herbs, non-prescription drugs, or dietary supplements you use. Also tell them if you smoke, drink alcohol, or use illegal drugs. Some items may interact with your medicine. What should I watch for while using this medicine? Visit your doctor or health care professional for regular check ups. See your doctor if you or your partner has sexual contact with others, becomes HIV positive, or gets a sexual transmitted disease. This product does not protect you against HIV infection (AIDS) or other sexually transmitted diseases. You can check the placement of the IUD yourself by reaching up to the top of your vagina with clean fingers to feel the threads. Do not pull on the threads. It is a good habit to check placement after each menstrual period. Call your doctor right away if you feel more of the IUD than just the threads or if you cannot feel the threads at all. The IUD may come out by itself. You may become pregnant if the device comes out. If you notice that the IUD has come out use a backup birth control method  like condoms and call your health care provider. Using tampons will not change the position of the IUD and are okay to use during your period. What side effects  may I notice from receiving this medicine? Side effects that you should report to your doctor or health care professional as soon as possible: -allergic reactions like skin rash, itching or hives, swelling of the face, lips, or tongue -fever, flu-like symptoms -genital sores -high blood pressure -no menstrual period for 6 weeks during use -pain, swelling, warmth in the leg -pelvic pain or tenderness -severe or sudden headache -signs of pregnancy -stomach cramping -sudden shortness of breath -trouble with balance, talking, or walking -unusual vaginal bleeding, discharge -yellowing of the eyes or skin Side effects that usually do not require medical attention (report to your doctor or health care professional if they continue or are bothersome): -acne -breast pain -change in sex drive or performance -changes in weight -cramping, dizziness, or faintness while the device is being inserted -headache -irregular menstrual bleeding within first 3 to 6 months of use -nausea This list may not describe all possible side effects. Call your doctor for medical advice about side effects. You may report side effects to FDA at 1-800-FDA-1088. Where should I keep my medicine? This does not apply. NOTE: This sheet is a summary. It may not cover all possible information. If you have questions about this medicine, talk to your doctor, pharmacist, or health care provider.  2015, Elsevier/Gold Standard. (2011-04-12 13:54:04)

## 2014-07-07 NOTE — Progress Notes (Signed)
Patient ID: Carrie Arias, female   DOB: 07/13/1980, 34 y.o.   MRN: [id number] History of Present Illness: Marchelle Folksmanda is a 34 year old white female, married in for well woman gyn exam and pap.She has been on antibiotic and has vaginal itching.She has IUD that needs replacing, has been in since 01/2006.   Current Medications, Allergies, Past Medical History, Past Surgical History, Family History and Social History were [facility].     Review of Systems: Patient denies any headaches, hearing loss, fatigue, blurred vision, shortness of breath, chest pain, abdominal pain, problems with bowel movements, urination, or intercourse. No joint pain or mood swings.+vaginal itch    Physical Exam:BP 130/82 mmHg  Pulse 84  Ht 5' 3.25" (1.607 m)  Wt 189 lb 8 oz (85.957 kg)  BMI 33.29 kg/m2 General:  Well developed, well nourished, no acute distress Skin:  Warm and dry Neck:  Midline trachea, normal thyroid, good ROM, no lymphadenopathy Lungs; Clear to auscultation bilaterally Breast:  No dominant palpable mass, retraction, or nipple discharge Cardiovascular: Regular rate and rhythm Abdomen:  Soft, non tender, no hepatosplenomegaly,has ventral hernia  Pelvic:  External genitalia is normal in appearance, no lesions.  The vagina is normal in appearance. Urethra has no lesions or masses. The cervix is smooth, +IUD strings, pap with HPV performed.  Uterus is felt to be normal size, shape, and contour.  No adnexal masses or tenderness noted.Bladder is non tender, no masses felt. Extremities/musculoskeletal:  No swelling or varicosities noted, no clubbing or cyanosis Psych:  No mood changes, alert and cooperative,seems happy Last sex a week ago, wil check insurance and [facility] and then 4/20 take Cytotec that morning and then come in  for IUD removal and insertion   Impression: Well woman gyn exam with pap IUD in place Vaginal itch Contraceptive  management    Plan: [facility], will talk in pm if negative take cytotec4/20 am Return 4/20 for IUD removal and reinsertion in pm take cytotec 400 mcg in am of 4/20 Rxcytotec 200 mcg # 2 take 2 am 4/20,  Physical in 1 year Rx diflucan 150 mg # 2 take 1 now and 1 in 3 days if needed  Review handout on mirena  Use condoms

## 2014-07-09 LAB — CYTOLOGY - PAP

## 2014-07-13 ENCOUNTER — Telehealth: Payer: Self-pay | Admitting: *Deleted

## 2014-07-13 LAB — BETA HCG QUANT (REF LAB): hCG Quant: <1 m[IU]/mL

## 2014-07-13 NOTE — Telephone Encounter (Signed)
[facility] called to notify of STAT results QHCG <1.

## 2014-07-14 ENCOUNTER — Telehealth: Payer: Self-pay | Admitting: Adult Health

## 2014-07-14 ENCOUNTER — Encounter: Payer: Self-pay | Admitting: Women's Health

## 2014-07-14 ENCOUNTER — Ambulatory Visit (INDEPENDENT_AMBULATORY_CARE_PROVIDER_SITE_OTHER): Payer: 59 | Admitting: Women's Health

## 2014-07-14 VITALS — BP 130/82 | HR 80 | Ht 63.0 in | Wt 188.5 lb

## 2014-07-14 DIAGNOSIS — Z30433 Encounter for removal and reinsertion of intrauterine contraceptive device: Secondary | ICD-10-CM | POA: Diagnosis not present

## 2014-07-14 NOTE — Progress Notes (Signed)
Patient ID: Carrie Arias, female   DOB: 08/24/1980, 34 y.o.   MRN: [id number] Carrie Arias is a 34 y.o. year old G2P2 Caucasian female who presents for removal and replacement of a Mirena IUD. She was given informed consent for removal and reinsertion of her Mirena. Her Mirena was placed 2007, No LMP recorded. Patient is not currently having periods (Reason: IUD)., and her pregnancy test today was neg. Last sex >2wks ago.  She took cytotec this am.   The risks and benefits of the method and placement have been thouroughly reviewed with the patient and all questions were answered.  Specifically the patient is aware of failure rate of 03/998, expulsion of the IUD and of possible perforation.  The patient is aware of irregular bleeding due to the method and understands the incidence of irregular bleeding diminishes with time.  Signed copy of informed consent in chart.   No LMP recorded. Patient is not currently having periods (Reason: IUD). BP 130/82 mmHg  Pulse 80  Ht 5\' 3"  (1.6 m)  Wt 188 lb 8 oz (85.503 kg)  BMI 33.40 kg/m2 No results found for this or any previous visit (from the past 24 hour(s)).   Appropriate time out taken. A graves speculum was placed in the vagina.  The cervix was visualized, prepped using Betadine. The strings were visible. They were grasped and the Mirena was easily removed. The cervix was then grasped with a single-tooth tenaculum. The uterus was found to be anteroflexed and it sounded to 7 cm.  Mirena IUD placed per manufacturer's recommendations without complications. The strings were trimmed to 3 cm.  The patient tolerated the procedure well.   Sonogram was performed and the proper placement of the IUD was verified via transvaginal u/s by amber, ultrasonographer.   The patient was given post procedure instructions, including signs and symptoms of infection and to check for the strings after each menses or each month, and refraining from intercourse or anything in the  vagina for 3 days.  She was given a Mirena care card with date Mirena placed, and date Mirena to be removed.  She is scheduled for a f/u appointment in 4 weeks.   Marge DuncansBooker, Nishka Heide Randall CNM, [facility] CenterWHNP-BC 07/14/2014 5:06 PM

## 2014-07-14 NOTE — Patient Instructions (Signed)
 Nothing in vagina for 3 days (no sex, douching, tampons, etc...)  Check your strings once a month to make sure you can feel them, if you are not able to please let us know  If you develop a fever of 100.4 or more in the next few weeks, or if you develop severe abdominal pain, please let us know  Use a backup method of birth control, such as condoms, for 2 weeks   Levonorgestrel intrauterine device (IUD) What is this medicine? LEVONORGESTREL IUD (LEE voe nor jes trel) is a contraceptive (birth control) device. The device is placed inside the uterus by a healthcare professional. It is used to prevent pregnancy and can also be used to treat heavy bleeding that occurs during your period. Depending on the device, it can be used for 3 to 5 years. This medicine may be used for other purposes; ask your health care provider or pharmacist if you have questions. COMMON BRAND NAME(S): LILETTA, Mirena, Skyla What should I tell my health care provider before I take this medicine? They need to know if you have any of these conditions: -abnormal Pap smear -cancer of the breast, uterus, or cervix -diabetes -endometritis -genital or pelvic infection now or in the past -have more than one sexual partner or your partner has more than one partner -heart disease -history of an ectopic or tubal pregnancy -immune system problems -IUD in place -liver disease or tumor -problems with blood clots or take blood-thinners -use intravenous drugs -uterus of unusual shape -vaginal bleeding that has not been explained -an unusual or allergic reaction to levonorgestrel, other hormones, silicone, or polyethylene, medicines, foods, dyes, or preservatives -pregnant or trying to get pregnant -breast-feeding How should I use this medicine? This device is placed inside the uterus by a health care professional. Talk to your pediatrician regarding the use of this medicine in children. Special care may be  needed. Overdosage: If you think you have taken too much of this medicine contact a poison control center or emergency room at once. NOTE: This medicine is only for you. Do not share this medicine with others. What if I miss a dose? This does not apply. What may interact with this medicine? Do not take this medicine with any of the following medications: -amprenavir -bosentan -fosamprenavir This medicine may also interact with the following medications: -aprepitant -barbiturate medicines for inducing sleep or treating seizures -bexarotene -griseofulvin -medicines to treat seizures like carbamazepine, ethotoin, felbamate, oxcarbazepine, phenytoin, topiramate -modafinil -pioglitazone -rifabutin -rifampin -rifapentine -some medicines to treat HIV infection like atazanavir, indinavir, lopinavir, nelfinavir, tipranavir, ritonavir -St. John's wort -warfarin This list may not describe all possible interactions. Give your health care provider a list of all the medicines, herbs, non-prescription drugs, or dietary supplements you use. Also tell them if you smoke, drink alcohol, or use illegal drugs. Some items may interact with your medicine. What should I watch for while using this medicine? Visit your doctor or health care professional for regular check ups. See your doctor if you or your partner has sexual contact with others, becomes HIV positive, or gets a sexual transmitted disease. This product does not protect you against HIV infection (AIDS) or other sexually transmitted diseases. You can check the placement of the IUD yourself by reaching up to the top of your vagina with clean fingers to feel the threads. Do not pull on the threads. It is a good habit to check placement after each menstrual period. Call your doctor right away if you feel   more of the IUD than just the threads or if you cannot feel the threads at all. The IUD may come out by itself. You may become pregnant if the device  comes out. If you notice that the IUD has come out use a backup birth control method like condoms and call your health care provider. Using tampons will not change the position of the IUD and are okay to use during your period. What side effects may I notice from receiving this medicine? Side effects that you should report to your doctor or health care professional as soon as possible: -allergic reactions like skin rash, itching or hives, swelling of the face, lips, or tongue -fever, flu-like symptoms -genital sores -high blood pressure -no menstrual period for 6 weeks during use -pain, swelling, warmth in the leg -pelvic pain or tenderness -severe or sudden headache -signs of pregnancy -stomach cramping -sudden shortness of breath -trouble with balance, talking, or walking -unusual vaginal bleeding, discharge -yellowing of the eyes or skin Side effects that usually do not require medical attention (report to your doctor or health care professional if they continue or are bothersome): -acne -breast pain -change in sex drive or performance -changes in weight -cramping, dizziness, or faintness while the device is being inserted -headache -irregular menstrual bleeding within first 3 to 6 months of use -nausea This list may not describe all possible side effects. Call your doctor for medical advice about side effects. You may report side effects to FDA at 1-800-FDA-1088. Where should I keep my medicine? This does not apply. NOTE: This sheet is a summary. It may not cover all possible information. If you have questions about this medicine, talk to your doctor, pharmacist, or health care provider.  2015, Elsevier/Gold Standard. (2011-04-12 13:54:04)  

## 2014-07-14 NOTE — Telephone Encounter (Signed)
Spoke with pt letting her know quant hcg was negative. Pt voiced understanding. JSY

## 2014-08-11 ENCOUNTER — Ambulatory Visit: Payer: 59 | Admitting: Women's Health

## 2015-01-20 ENCOUNTER — Encounter: Payer: Self-pay | Admitting: Family Medicine

## 2015-03-09 ENCOUNTER — Telehealth: Payer: Self-pay | Admitting: Family Medicine

## 2015-03-09 NOTE — Telephone Encounter (Signed)
[facility]. Please see fax

## 2015-04-22 IMAGING — CT CT MAXILLOFACIAL W/ CM
3 series · 16 of 47 positions shown, 19 images · IV contrast (omnipaque)
Comparison: None.

CLINICAL DATA: Recent root canal and significant left facial
swelling. The history recorded by the technologist incorrect. Then
root canals on the left.

EXAM:
CT MAXILLOFACIAL WITH CONTRAST
TECHNIQUE: Multidetector CT imaging of the maxillofacial structures was
performed with intravenous contrast. Multiplanar CT image
reconstructions were also generated. A small metallic BB was placed
on the right temple in order to reliably differentiate right from
left.
CONTRAST:  100mL OMNIPAQUE IOHEXOL 300 MG/ML  SOLN

[Series 2: facial/ orbits 2.0 h30s · axial · 0.32mm/px · z∈[-184,-58]mm · 10 of 73 slices shown, 13 images]
[im 5/73  brain]
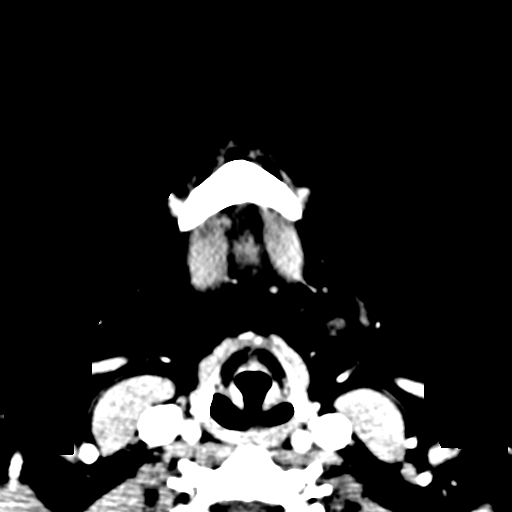
[im 5/73  bone]
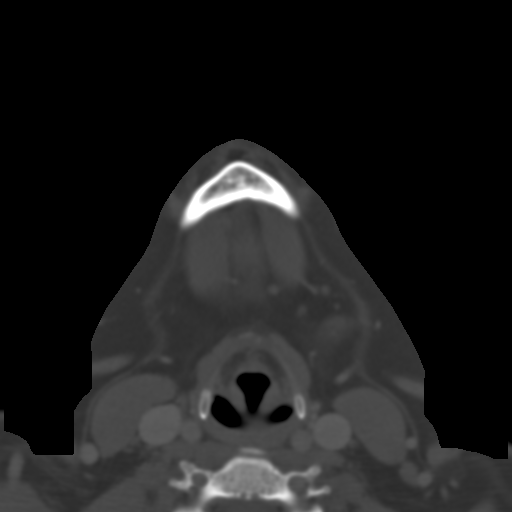
[im 13/73  bone]
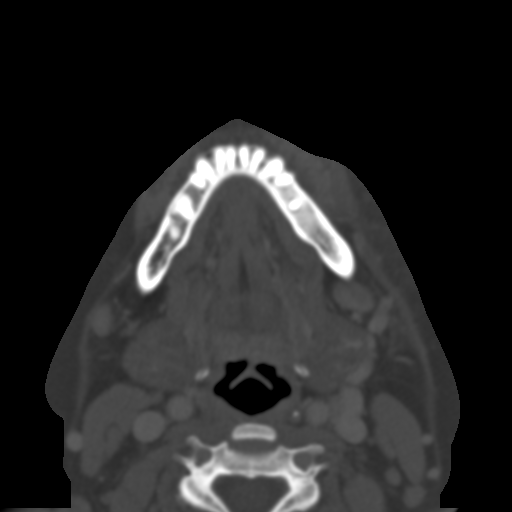
[im 20/73  bone]
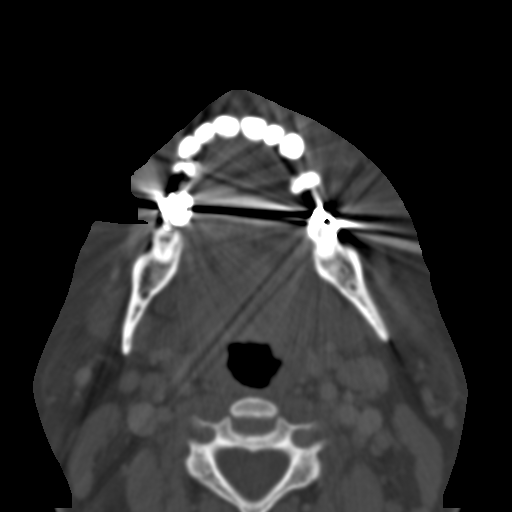
[im 25/73  bone]
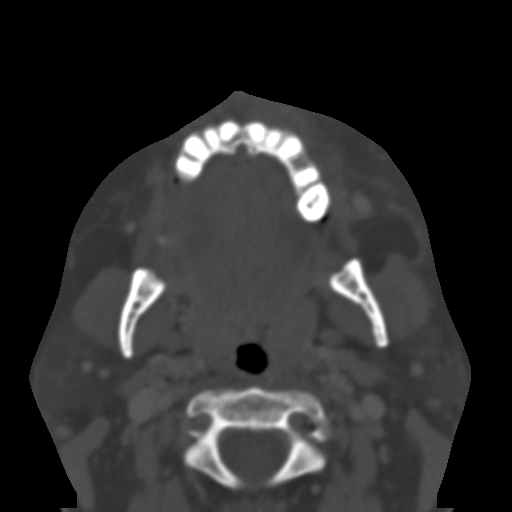
[im 33/73  brain]
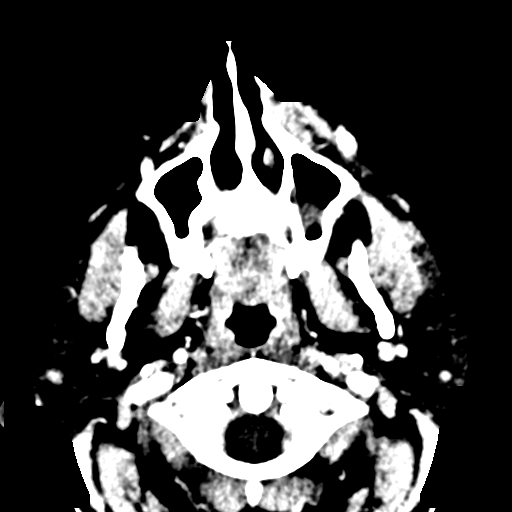
[im 33/73  bone]
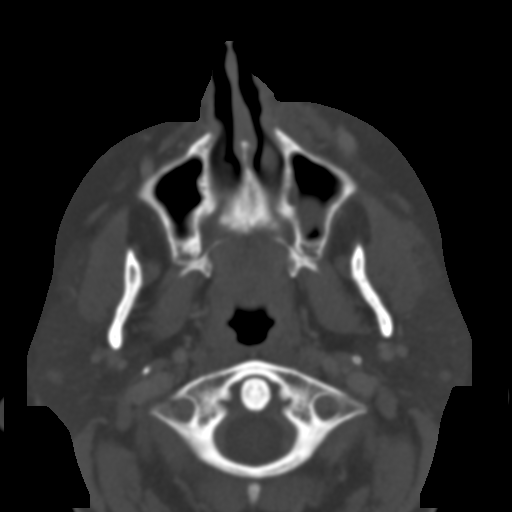
[im 40/73  bone]
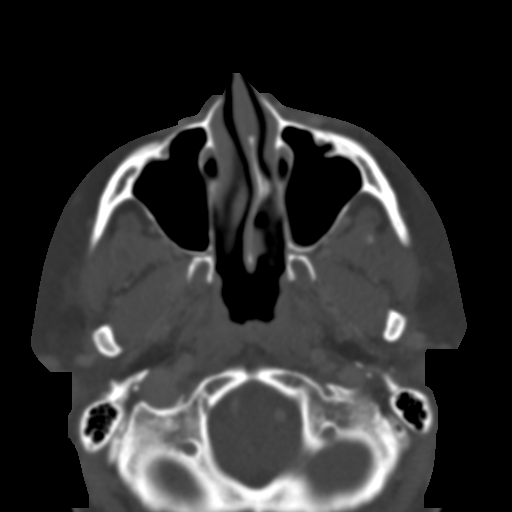
[im 48/73  bone]
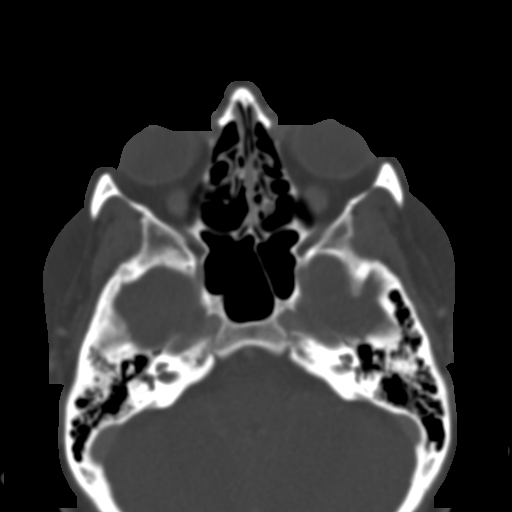
[im 55/73  bone]
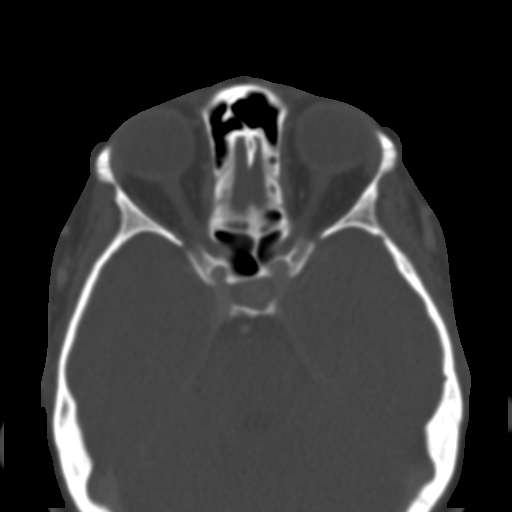
[im 60/73  brain]
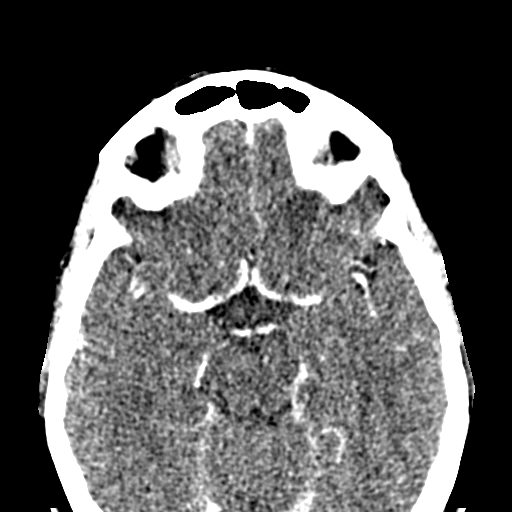
[im 60/73  bone]
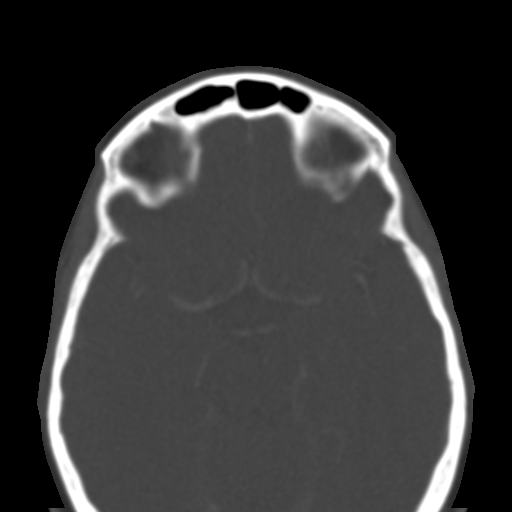
[im 68/73  bone]
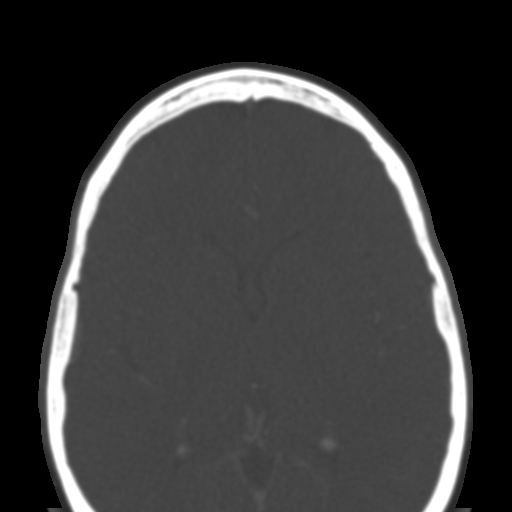

[Series 6: coronal soft tissue · coronal · 0.31mm/px · 3 of 75 slices shown]
[im 25/75  bone]
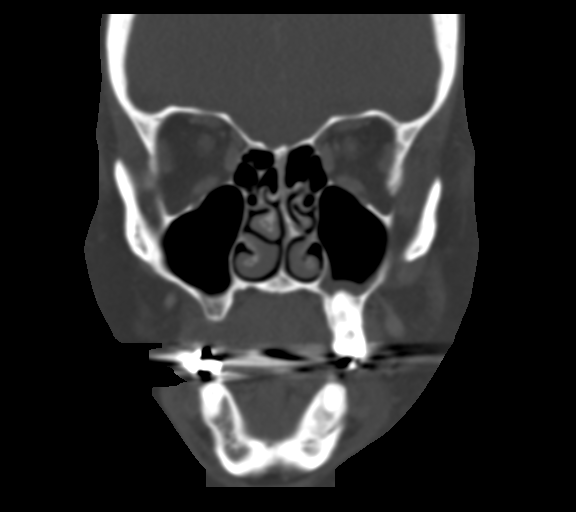
[im 33/75  bone]
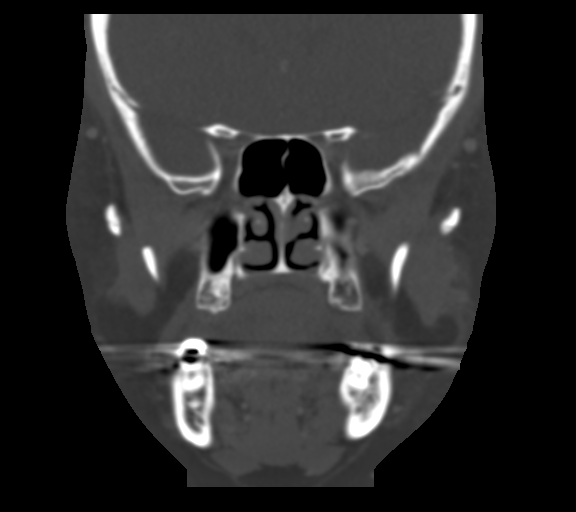
[im 42/75  bone]
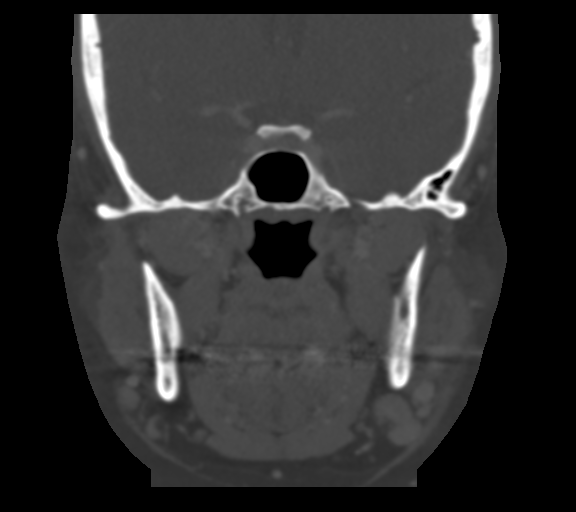

[Series 7: sagittal soft tissue · sagittal · 0.34mm/px · 3 of 77 slices shown]
[im 26/77  bone]
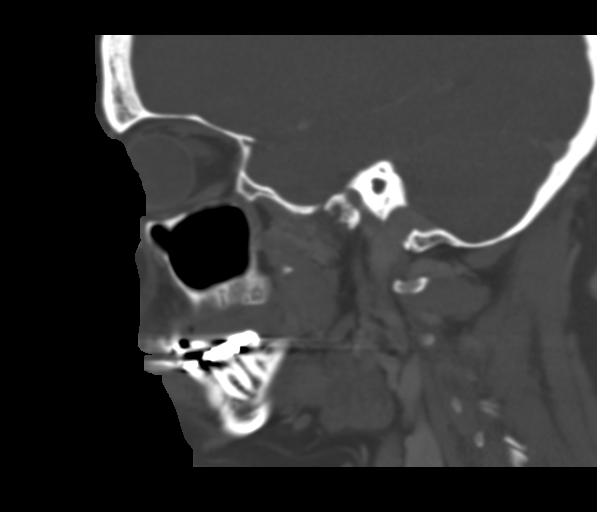
[im 39/77  bone]
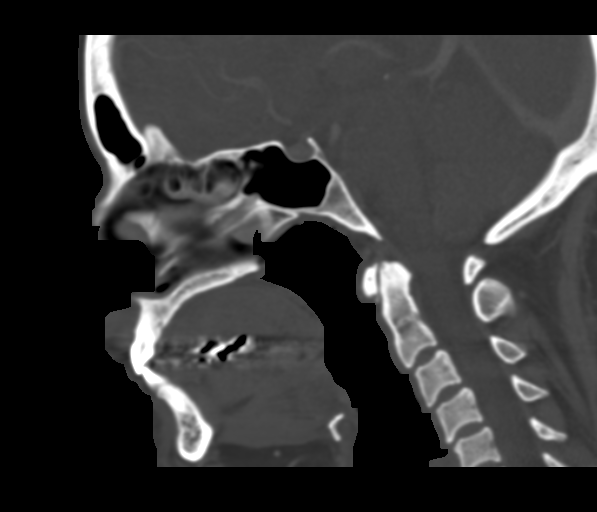
[im 51/77  bone]
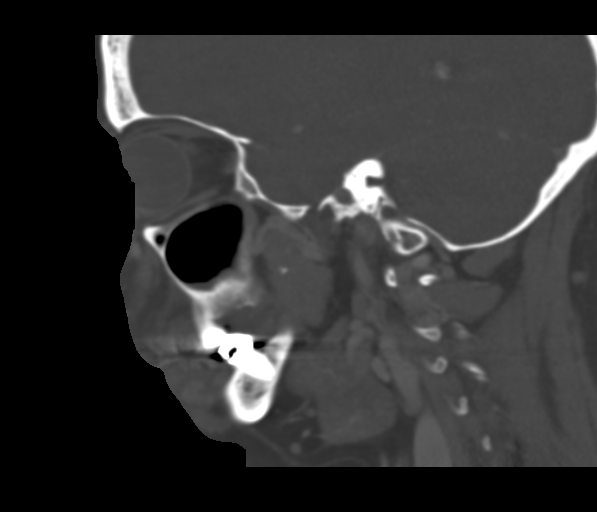

[16 of 47 positions shown; findings below may reference images not displayed]

FINDINGS: There is edema and inflammation in the left subcutaneous tissues of
the jaw. Streak artifact from the dental fillings markedly degrades
assessment adjacent to the upper teeth. There is no gross facial
abscess evident. Mild left-sided lymphadenopathy is noted. The
visualized vascular at anatomy opacifies normally and symmetrically.
IMPRESSION: Edema/ inflammation in the soft tissues of the left cheek and jaw
without an identifiable underlying abscess. Imaging features are
compatible with cellulitis.

## 2015-04-29 ENCOUNTER — Telehealth: Payer: Self-pay | Admitting: *Deleted

## 2015-04-29 NOTE — Telephone Encounter (Signed)
[facility]- Dr. Brett Canales wants to make sure pt followed up on mammo. See form at nurse's station. Pt had mammo on 12/15/14 and was recommended to do further testing. Letter sent to dr Brett Canales on dec 9th stating pt has not scheduled or returned to their facility. Letter sent from [facility].

## 2015-05-02 NOTE — Telephone Encounter (Signed)
Spoke with patient and informed her that we received letter from [facility] stating that she needed to follow up for mammogram and further testing. Patient verbalized understanding and stated that she would call the facility.

## 2015-05-11 ENCOUNTER — Ambulatory Visit (INDEPENDENT_AMBULATORY_CARE_PROVIDER_SITE_OTHER): Payer: 59 | Admitting: Family Medicine

## 2015-05-11 ENCOUNTER — Encounter: Payer: Self-pay | Admitting: Family Medicine

## 2015-05-11 VITALS — BP 110/72 | Temp 98.3°F | Ht 63.0 in | Wt 189.0 lb

## 2015-05-11 DIAGNOSIS — J329 Chronic sinusitis, unspecified: Secondary | ICD-10-CM

## 2015-05-11 DIAGNOSIS — J209 Acute bronchitis, unspecified: Secondary | ICD-10-CM

## 2015-05-11 MED ORDER — AZITHROMYCIN 250 MG PO TABS: ORAL_TABLET | ORAL | Status: DC

## 2015-05-11 MED ORDER — BENZONATATE 100 MG PO CAPS: 100.0000 mg | ORAL_CAPSULE | Freq: Four times a day (QID) | ORAL | Status: DC | PRN

## 2015-05-11 NOTE — Progress Notes (Signed)
   Subjective:    Patient ID: Carrie Arias, female    DOB: [birth year], 35 y.o.   MRN: [id number]  Cough This is a new problem. Episode onset: one and a half weeks. Associated symptoms include a sore throat. Treatments tried: theraflu.   Cold in the family  Daughter had cold and runny nnose  Some productive  achey early on throat sore then, felt dim energy, all has improve   Review of Systems  HENT: Positive for sore throat.   Respiratory: Positive for cough.        Objective:   Physical Exam Alert mild malaise. Vitals stable. Intermittent bronchial cough during exam. HEENT moderate nasal congestion no frontal tenderness pharynx slight drainage lungs clear. Heart regular rate and rhythm.       Assessment & Plan:  Impression subacute bronchial/rhinosinusitis post viral infection discussed plan Z-Pak. Tessalon Perles when necessary. Symptom care discussed WSL

## 2015-08-04 ENCOUNTER — Encounter: Payer: Self-pay | Admitting: Family Medicine

## 2015-08-30 ENCOUNTER — Telehealth: Payer: Self-pay | Admitting: Family Medicine

## 2015-08-30 DIAGNOSIS — R5383 Other fatigue: Secondary | ICD-10-CM

## 2015-08-30 NOTE — Telephone Encounter (Signed)
tsh t4 cbc

## 2015-08-30 NOTE — Telephone Encounter (Signed)
Pt is wanting to have her thyroid checked due to staying tired. Pt has an appt scheduled for next tues. Can the labs be put in for her to have them done before hand?

## 2015-08-30 NOTE — Telephone Encounter (Signed)
Blood work ordered in EPIC. Patient notified. 

## 2015-09-02 LAB — CBC WITH DIFFERENTIAL/PLATELET
BASOS: 0 %
Basophils Absolute: 0 10*3/uL (ref 0.0–0.2)
EOS (ABSOLUTE): 0.2 10*3/uL (ref 0.0–0.4)
Eos: 2 %
Hematocrit: 39.8 % (ref 34.0–46.6)
Hemoglobin: 12.7 g/dL (ref 11.1–15.9)
IMMATURE GRANS (ABS): 0.1 10*3/uL (ref 0.0–0.1)
Immature Granulocytes: 1 %
Lymphocytes Absolute: 3.4 10*3/uL — ABNORMAL HIGH (ref 0.7–3.1)
Lymphs: 33 %
MCH: 29.3 pg (ref 26.6–33.0)
MCHC: 31.9 g/dL (ref 31.5–35.7)
MCV: 92 fL (ref 79–97)
MONOCYTES: 8 %
Monocytes Absolute: 0.8 10*3/uL (ref 0.1–0.9)
NEUTROS ABS: 5.8 10*3/uL (ref 1.4–7.0)
NEUTROS PCT: 56 %
PLATELETS: 373 10*3/uL (ref 150–379)
RBC: 4.34 x10E6/uL (ref 3.77–5.28)
RDW: 14 % (ref 12.3–15.4)
WBC: 10.3 10*3/uL (ref 3.4–10.8)

## 2015-09-02 LAB — TSH: TSH: 1.62 u[IU]/mL (ref 0.450–4.500)

## 2015-09-02 LAB — T4, FREE: Free T4: 0.96 ng/dL (ref 0.82–1.77)

## 2015-09-06 ENCOUNTER — Ambulatory Visit (INDEPENDENT_AMBULATORY_CARE_PROVIDER_SITE_OTHER): Payer: 59 | Admitting: Nurse Practitioner

## 2015-09-06 ENCOUNTER — Encounter: Payer: Self-pay | Admitting: Nurse Practitioner

## 2015-09-06 VITALS — BP 120/84 | Ht 63.0 in | Wt 186.0 lb

## 2015-09-06 DIAGNOSIS — K458 Other specified abdominal hernia without obstruction or gangrene: Secondary | ICD-10-CM | POA: Diagnosis not present

## 2015-09-06 DIAGNOSIS — R5383 Other fatigue: Secondary | ICD-10-CM | POA: Diagnosis not present

## 2015-09-06 DIAGNOSIS — R928 Other abnormal and inconclusive findings on diagnostic imaging of breast: Secondary | ICD-10-CM | POA: Diagnosis not present

## 2015-09-06 MED ORDER — PHENTERMINE HCL 37.5 MG PO TABS: 37.5000 mg | ORAL_TABLET | Freq: Every day | ORAL | Status: DC

## 2015-09-08 ENCOUNTER — Encounter: Payer: Self-pay | Admitting: Nurse Practitioner

## 2015-09-08 DIAGNOSIS — R928 Other abnormal and inconclusive findings on diagnostic imaging of breast: Secondary | ICD-10-CM | POA: Insufficient documentation

## 2015-09-08 DIAGNOSIS — K458 Other specified abdominal hernia without obstruction or gangrene: Secondary | ICD-10-CM | POA: Insufficient documentation

## 2015-09-08 NOTE — Progress Notes (Addendum)
Subjective:  Presents to discuss her recent thyroid tests. Fatigue. Exercises on a regular basis and eats a healthy diet but has had difficulty losing weight. Has a family history of diabetes. Also patient had a recent abnormal diagnostic mammogram and ultrasound on her left breast [facility] through her employer. Normally gets her mammogram through [facility]. Has had a lower abd hernia for years. Non tender. Feels slight lower than usual. No increase in size. Was told by surgeon not to worry unless painful or larger.   Objective:   BP 120/84 mmHg  Ht 5\' 3"  (1.6 m)  Wt 186 lb (84.369 kg)  BMI 32.96 kg/m2 NAD. Alert, oriented. Thyroid normal limit to palpation, nontender, no mass or goiter. Lungs clear. Heart regular rate rhythm. Central obesity noted. Mammogram dated 07/22/15 shows 2 suspicious masses in the left breast. Soft nontender hernia noted mid lower abd.   Assessment:  Problem List Items Addressed This Visit      Other   Abnormal mammogram   Morbid obesity due to excess calories (HCC)   Relevant Medications   phentermine (ADIPEX-P) 37.5 MG tablet   Other Relevant Orders   Lipid panel   Hepatic function panel   Basic metabolic panel   HgB A1c   Insulin, fasting   Other specified abdominal hernia without obstruction or gangrene   Relevant Orders   Lipid panel   Hepatic function panel   Basic metabolic panel   HgB A1c    Other Visit Diagnoses    Other fatigue    -  Primary    Relevant Orders    Lipid panel    Hepatic function panel    Basic metabolic panel    HgB A1c    Insulin, fasting       Plan:  Meds ordered this encounter  Medications  . phentermine (ADIPEX-P) 37.5 MG tablet    Sig: Take 1 tablet (37.5 mg total) by mouth daily before breakfast.    Dispense:  30 tablet    Refill:  0    Order Specific Question:  Supervising Provider    Answer:  Merlyn AlbertLUKING, WILLIAM S [[id number]]   Continue healthy diet and activity. Reviewed potential adverse effects of  phentermine, DC med and call if any problems. Will refer to specialist for evaluation of abnormal mammogram and ultrasound. Return in about 1 month (around 10/06/2015).

## 2015-09-12 ENCOUNTER — Telehealth: Payer: Self-pay | Admitting: Nurse Practitioner

## 2015-09-12 ENCOUNTER — Other Ambulatory Visit: Payer: Self-pay | Admitting: Nurse Practitioner

## 2015-09-12 DIAGNOSIS — R928 Other abnormal and inconclusive findings on diagnostic imaging of breast: Secondary | ICD-10-CM

## 2015-09-12 NOTE — Telephone Encounter (Signed)
Also advised patient to verify DOB; our records have it as 04/04/1980 which is correct; [facility] has it as 08/23/1980

## 2015-09-12 NOTE — Telephone Encounter (Signed)
Patient had inquired about her abnormal mammogram at visit on 6/13. She had an original mammogram done at mobile unit at work. Did not follow up for diagnostic and US until April. [facility] recommends a biopsy based on findings. Could not get in touch with patient after multiple attempts. After clarifying the situation, I spoke with patient by phone today. She had a fibrocystic area when she was 15 which was benign. Explained that these are common, to let her provider know but this had no bearing on her current situation. Strongly recommend that patient contact the Breast Center for followup and biopsy. Given phone number. She agrees to call them and schedule follow up appointment. It has already been 9 months since her initial mammogram so advised her not to wait.

## 2015-09-19 ENCOUNTER — Telehealth: Payer: Self-pay | Admitting: Family Medicine

## 2015-09-19 NOTE — Telephone Encounter (Signed)
[facility] called needing order for needle biopsy of mass in left breast  Appt is 09/20/15 @ 8:00am  [facility] fax to [facility] @ [facility]# [phone]  Questions call Ph# [phone]    (I know Eber JonesCarolyn spoke to pt last week)

## 2015-09-19 NOTE — Telephone Encounter (Signed)
Order written; awaiting signature.  

## 2015-09-19 NOTE — Telephone Encounter (Signed)
Yes please write the order and I'll sign. Thanks.

## 2015-09-19 NOTE — Telephone Encounter (Signed)
May we go ahead and write the order

## 2015-09-30 ENCOUNTER — Other Ambulatory Visit: Payer: Self-pay | Admitting: Adult Health

## 2015-10-07 ENCOUNTER — Ambulatory Visit: Payer: 59 | Admitting: Nurse Practitioner

## 2015-10-07 LAB — LIPID PANEL
CHOL/HDL RATIO: 5.1 ratio — AB (ref 0.0–4.4)
Cholesterol, Total: 184 mg/dL (ref 100–199)
HDL: 36 mg/dL — ABNORMAL LOW (ref >39)
LDL Calculated: 134 mg/dL — ABNORMAL HIGH (ref 0–99)
Triglycerides: 69 mg/dL (ref 0–149)
VLDL Cholesterol Cal: 14 mg/dL (ref 5–40)

## 2015-10-07 LAB — BASIC METABOLIC PANEL
BUN / CREAT RATIO: 20 (ref 9–23)
BUN: 17 mg/dL (ref 6–20)
CALCIUM: 9.6 mg/dL (ref 8.7–10.2)
CO2: 24 mmol/L (ref 18–29)
CREATININE: 0.86 mg/dL (ref 0.57–1.00)
Chloride: 99 mmol/L (ref 96–106)
GFR calc non Af Amer: 87 mL/min/{1.73_m2} (ref >59)
GFR, EST AFRICAN AMERICAN: 100 mL/min/{1.73_m2} (ref >59)
Glucose: 89 mg/dL (ref 65–99)
Potassium: 3.8 mmol/L (ref 3.5–5.2)
Sodium: 139 mmol/L (ref 134–144)

## 2015-10-07 LAB — HEPATIC FUNCTION PANEL
ALT: 22 IU/L (ref 0–32)
AST: 15 IU/L (ref 0–40)
Albumin: 4.6 g/dL (ref 3.5–5.5)
Alkaline Phosphatase: 47 IU/L (ref 39–117)
BILIRUBIN TOTAL: 0.5 mg/dL (ref 0.0–1.2)
BILIRUBIN, DIRECT: 0.14 mg/dL (ref 0.00–0.40)
TOTAL PROTEIN: 7 g/dL (ref 6.0–8.5)

## 2015-10-07 LAB — HEMOGLOBIN A1C
Est. average glucose Bld gHb Est-mCnc: 111 mg/dL
HEMOGLOBIN A1C: 5.5 % (ref 4.8–5.6)

## 2015-10-11 ENCOUNTER — Other Ambulatory Visit: Payer: Self-pay | Admitting: Nurse Practitioner

## 2015-10-11 ENCOUNTER — Encounter: Payer: Self-pay | Admitting: Nurse Practitioner

## 2015-10-11 ENCOUNTER — Ambulatory Visit (INDEPENDENT_AMBULATORY_CARE_PROVIDER_SITE_OTHER): Payer: 59 | Admitting: Nurse Practitioner

## 2015-10-11 VITALS — BP 120/82 | Ht 63.0 in | Wt 174.6 lb

## 2015-10-11 DIAGNOSIS — R5383 Other fatigue: Secondary | ICD-10-CM

## 2015-10-11 MED ORDER — PHENTERMINE HCL 37.5 MG PO TABS: 37.5000 mg | ORAL_TABLET | Freq: Every day | ORAL | Status: DC

## 2015-10-13 ENCOUNTER — Encounter: Payer: Self-pay | Admitting: Nurse Practitioner

## 2015-10-13 NOTE — Progress Notes (Signed)
Subjective:  Presents for follow-up. Doing well on phentermine. Has lost significant weight. Denies any adverse affects. Diet much healthier. Trying to stay active. Would also like to review her labs from 7/13. Energy level improving with weight loss.  Objective:   BP 120/82 mmHg  Ht 5\' 3"  (1.6 m)  Wt 174 lb 9.6 oz (79.198 kg)  BMI 30.94 kg/m2 NAD. Alert, oriented. Lungs clear. Heart regular rate rhythm. Reviewed labs from 7/13 with patient.  Assessment: Other fatigue  Morbid obesity due to excess calories (HCC)  Plan:  Meds ordered this encounter  Medications  . phentermine (ADIPEX-P) 37.5 MG tablet    Sig: Take 1 tablet (37.5 mg total) by mouth daily before breakfast.    Dispense:  30 tablet    Refill:  2    Order Specific Question:  Supervising Provider    Answer:  Merlyn AlbertLUKING, WILLIAM S [[id number]]   Continue phentermine as directed. If she wishes to continue, recheck in 2 months. Continue dietary measures and exercise.

## 2015-10-26 ENCOUNTER — Encounter: Payer: Self-pay | Admitting: Family Medicine

## 2016-04-26 ENCOUNTER — Telehealth: Payer: Self-pay

## 2016-04-26 NOTE — Telephone Encounter (Signed)
[facility] on this patient. Form in folder in doctor's office.

## 2016-05-14 NOTE — Telephone Encounter (Signed)
Left message to return call 

## 2016-05-14 NOTE — Telephone Encounter (Signed)
Call pt, advise her she needs to f u for abnrml mammo as rec per [facility], since she goes to [facility] gyn ck ups, plz have [facility] start sending all info in their direction re mammo's, not to us

## 2016-05-14 NOTE — Telephone Encounter (Signed)
Notified pt to call [facility] and follow up with the breast center. Form sent back to [facility] for them to send all info to pt[facility]yn - [facility].

## 2017-07-10 ENCOUNTER — Encounter: Payer: 59 | Admitting: Nurse Practitioner

## 2017-07-17 ENCOUNTER — Ambulatory Visit (INDEPENDENT_AMBULATORY_CARE_PROVIDER_SITE_OTHER): Payer: BLUE CROSS/BLUE SHIELD | Admitting: Nurse Practitioner

## 2017-07-17 ENCOUNTER — Encounter: Payer: Self-pay | Admitting: Nurse Practitioner

## 2017-07-17 VITALS — BP 114/76 | Ht 63.0 in | Wt 184.6 lb

## 2017-07-17 DIAGNOSIS — Z0000 Encounter for general adult medical examination without abnormal findings: Secondary | ICD-10-CM | POA: Diagnosis not present

## 2017-07-17 MED ORDER — PHENTERMINE HCL 37.5 MG PO TABS: 37.5000 mg | ORAL_TABLET | Freq: Every day | ORAL | 2 refills | Status: DC

## 2017-07-17 NOTE — Patient Instructions (Signed)
Zaditor eye drops

## 2017-07-18 ENCOUNTER — Encounter: Payer: Self-pay | Admitting: Nurse Practitioner

## 2017-07-18 NOTE — Progress Notes (Signed)
Subjective:  Presents for her wellness visit. Gets regular gynecological exams. Has changed to a more healthy diet. Has lost 6 lbs in the past 2 weeks. No bleeding on Mirena. Just started a walking program. Plans to increase her activity. Regular vision and dental exams. Would like to restart Phentermine to jump start weight loss. Has taken in the past without difficulty.   Objective:   BP 114/76   Ht 5\' 3"  (1.6 m)   Wt 184 lb 9.6 oz (83.7 kg)   BMI 32.70 kg/m  NAD. Alert, oriented. TMs mild clear effusion. Pharynx clear. Neck supple with minimal adenopathy. Thyroid non tender; no mass or goiter noted. Lungs clear. Heart RRR. Abdomen soft, non tender.   Assessment:   Problem List Items Addressed This Visit      Other   Morbid obesity due to excess calories (HCC)   Relevant Medications   phentermine (ADIPEX-P) 37.5 MG tablet    Other Visit Diagnoses    Routine general medical examination at a health care facility    -  Primary   Relevant Orders   CBC with Differential   Basic Metabolic Panel (BMET)   Hepatic function panel   Lipid panel   TSH   Vitamin D (25 hydroxy)       Plan:   Meds ordered this encounter  Medications  . phentermine (ADIPEX-P) 37.5 MG tablet    Sig: Take 1 tablet (37.5 mg total) by mouth daily before breakfast.    Dispense:  30 tablet    Refill:  2    Order Specific Question:   Supervising Provider    Answer:   Merlyn AlbertLUKING, WILLIAM S [[id number]]   Encouraged continued activity, healthy diet and weight loss efforts.  Routine labs pending. Recheck in 3 months if she wishes to continue Phentermine.  Return in about 1 year (around 07/18/2018) for physical.  25 minutes was spent with the patient.  This statement verifies that 25 minutes was indeed spent with the patient. Greater than half the time was spent in discussion, counseling and answering questions  regarding the issues that the patient came in for today as reflected in the diagnosis (s) please refer to  documentation for further details.

## 2018-12-10 ENCOUNTER — Telehealth: Payer: Self-pay | Admitting: Family Medicine

## 2018-12-10 MED ORDER — VALACYCLOVIR HCL 1 G PO TABS: ORAL_TABLET | ORAL | 3 refills | Status: DC

## 2018-12-10 NOTE — Telephone Encounter (Signed)
Patient notified

## 2018-12-10 NOTE — Telephone Encounter (Signed)
Patient had a break out of fever blisters on her lip and wanting a pill form prescription into [place] last seen 07/17/17

## 2018-12-10 NOTE — Telephone Encounter (Addendum)
Prescription sent electronically to pharmacy. Left message to return call to notify patient. 

## 2018-12-10 NOTE — Telephone Encounter (Signed)
Valtrex 1 g two now two 12 hrs later numb 4 /3 ref

## 2019-02-02 ENCOUNTER — Telehealth: Payer: Self-pay | Admitting: Family Medicine

## 2019-02-02 ENCOUNTER — Other Ambulatory Visit: Payer: Self-pay | Admitting: *Deleted

## 2019-02-02 MED ORDER — VALACYCLOVIR HCL 1 G PO TABS: ORAL_TABLET | ORAL | 5 refills | Status: DC

## 2019-02-02 NOTE — Telephone Encounter (Signed)
Last visit wellness April 2019

## 2019-02-02 NOTE — Telephone Encounter (Signed)
Getting ready to go out of town and feels a fever blister coming on and needs a refill of Valtrex.  Said she has used all the refills she had.   [place]

## 2019-02-02 NOTE — Telephone Encounter (Signed)
Pt.notified

## 2019-02-02 NOTE — Telephone Encounter (Signed)
Med sent to pharm. Tried to call no answer 

## 2019-02-02 NOTE — Telephone Encounter (Signed)
Ok plus 6 ref 

## 2019-03-31 ENCOUNTER — Ambulatory Visit (INDEPENDENT_AMBULATORY_CARE_PROVIDER_SITE_OTHER): Payer: BC Managed Care – PPO | Admitting: Family Medicine

## 2019-03-31 ENCOUNTER — Other Ambulatory Visit: Payer: Self-pay

## 2019-03-31 DIAGNOSIS — U071 COVID-19: Secondary | ICD-10-CM

## 2019-03-31 NOTE — Progress Notes (Signed)
   Subjective:    Patient ID: Carrie Arias, female    DOB: January 30, 1981, 39 y.o.   MRN: [id number]  HPI4 days ago started feeling achy. Test for covid came back positive yesterday. Has congestion, headache, and body aches today. No fever in 2 days. Did feel like she was running a temp this morning.   Virtual Visit via Telephone Note  I connected with JOSHALYN ANCHETA on 03/31/19 at  9:30 AM EST by telephone and verified that I am speaking with the correct person using two identifiers.  Location: Patient: home Provider: office   I discussed the limitations, risks, security and privacy concerns of performing an evaluation and management service by telephone and the availability of in person appointments. I also discussed with the patient that there may be a patient responsible charge related to this service. The patient expressed understanding and agreed to proceed.   History of Present Illness:    Observations/Objective:   Assessment and Plan:   Follow Up Instructions:    I discussed the assessment and treatment plan with the patient. The patient was provided an opportunity to ask questions and all were answered. The patient agreed with the plan and demonstrated an understanding of the instructions.   The patient was advised to call back or seek an in-person evaluation if the symptoms worsen or if the condition fails to improve as anticipated.  I provided 20 minutes of non-face-to-face time during this encounter.   Felt achey sat  A bit if cough  Then heard from her mo who was sick  Tested pod  Rap testing was pos   otc taking first tyl, did not help    Review of Systems No chest pain no rash    Objective:   Physical Exam  Virtual      Assessment & Plan:  Impression COVID-19 infection.  Symptom care discussed.  Warning signs discussed.  Nature of quarantine discussed.  Questions answered

## 2019-04-27 ENCOUNTER — Encounter: Payer: Self-pay | Admitting: Family Medicine

## 2019-09-11 ENCOUNTER — Telehealth: Payer: Self-pay | Admitting: Family Medicine

## 2019-09-11 NOTE — Telephone Encounter (Signed)
Called and LM to schedule appt  

## 2019-09-11 NOTE — Telephone Encounter (Signed)
Please schedule visit and then route back. Wellness on 07/17/2017. Did not do bw ordered 2019 vit d, lipid, tsh, liver, bmp, cbc

## 2019-09-11 NOTE — Telephone Encounter (Signed)
Please schedule

## 2019-09-14 NOTE — Telephone Encounter (Signed)
lvm to schedule appt.  

## 2019-09-15 NOTE — Telephone Encounter (Signed)
lvm to schedule appt.  

## 2021-03-22 ENCOUNTER — Telehealth: Payer: Self-pay | Admitting: Nurse Practitioner

## 2021-03-22 NOTE — Telephone Encounter (Signed)
Pt has not had labs completed with [place] in 5 years. Please advise. Thank you.

## 2021-03-22 NOTE — Telephone Encounter (Signed)
Pt contacted and verbalized understanding.  

## 2021-03-22 NOTE — Telephone Encounter (Signed)
Patient has physical 04/03/21 and needing labs done

## 2021-04-03 ENCOUNTER — Other Ambulatory Visit: Payer: Self-pay

## 2021-04-03 ENCOUNTER — Encounter: Payer: Self-pay | Admitting: Nurse Practitioner

## 2021-04-03 ENCOUNTER — Ambulatory Visit (INDEPENDENT_AMBULATORY_CARE_PROVIDER_SITE_OTHER): Payer: BC Managed Care – PPO | Admitting: Nurse Practitioner

## 2021-04-03 VITALS — BP 130/88 | HR 89 | Temp 98.3°F | Ht 63.0 in | Wt 189.8 lb

## 2021-04-03 DIAGNOSIS — R519 Headache, unspecified: Secondary | ICD-10-CM

## 2021-04-03 DIAGNOSIS — M79601 Pain in right arm: Secondary | ICD-10-CM

## 2021-04-03 DIAGNOSIS — K439 Ventral hernia without obstruction or gangrene: Secondary | ICD-10-CM

## 2021-04-03 DIAGNOSIS — F419 Anxiety disorder, unspecified: Secondary | ICD-10-CM | POA: Diagnosis not present

## 2021-04-03 DIAGNOSIS — R5383 Other fatigue: Secondary | ICD-10-CM | POA: Diagnosis not present

## 2021-04-03 DIAGNOSIS — Z0001 Encounter for general adult medical examination with abnormal findings: Secondary | ICD-10-CM | POA: Diagnosis not present

## 2021-04-03 DIAGNOSIS — Z0000 Encounter for general adult medical examination without abnormal findings: Secondary | ICD-10-CM

## 2021-04-03 MED ORDER — BUPROPION HCL ER (XL) 150 MG PO TB24: 150.0000 mg | ORAL_TABLET | Freq: Every day | ORAL | 0 refills | Status: DC

## 2021-04-03 NOTE — Progress Notes (Addendum)
Subjective:    Patient ID: EEVA SCHLOSSER, female    DOB: 03/17/1981, 41 y.o.   MRN: [id number]  HPI The patient comes in today for a wellness visit.   A review of their health history was completed.  A review of medications was also completed.  Any needed refills; No  Eating habits: doesn't eat as healthy as she should.  Falls/  MVA accidents in past few months: No  Regular exercise: walks few times a week  Specialist pt sees on regular basis: No  Preventative health issues were discussed.   Immunizations:  COVID due TDAP due PAP smear due Flu vax due Mammogram UTD Eye exam UTD Dental Exam UTD   Additional concerns:   Headaches Patient states that she has a headache nearly every morning x2 months that occur when she wakes up and  is relieved once she gets up and starts moving around. Patient describes headaches as squeezing sensation around the head and sometimes over one of her eyes. Denies light sensitivity or associated nausea and vomiting. Patient was evaluated for sleep apnea a couple of years ago. Per patient, sleep apnea was not diagnosed at that time. Patient admits to snoring, morning tiredness, and night time waking. Patient denies waking short of breath or having apneic periods.  Anxiety Patient reports feeling more anxious lately over several months. She describes her anxiety as feeling overwhelmed where she cannot calm her self down. Patient denies any abnormal or new stressors. Patient also states that she feels tired and occassionally has nausea without vomiting. Patient does admit to some diarrhea but states that she has sensitive stomach. Patient admits to mood swings and agitation. When she is anxious she breaths heavily and tries to take deep breaths however they do not help with the anxious feelings. Patient denies chest pain of SOB.   Right arm soreness Patient report R arm soreness x1 month. Patient states that she holds her bad on her right arm and  notices that it seems to be sore in the evenings. Pain is typically tolerable, however patient has taken a ibuprofen for pain relief which was helpful. Rest is also helpful. Patient denies any tingling or numbness or weakness to her arm or hand.  Hernia Patient is concerned about a hernia to her lower abdomen. Per patient hernia has been there since her gall bladder removal several years ago. Area does not hurt but she is self-conscious about the area since it is so large. Patient wants to know if she needs to do anything about it or what can be done about it. Patient denies any pain to the area, any changes of color to the skin. Patient does believe that the hernias has possibly gotten larger.    Review of Systems  Constitutional:  Positive for fatigue. Negative for fever.  Gastrointestinal:  Positive for abdominal distention and diarrhea. Negative for abdominal pain, anal bleeding, blood in stool and constipation.  Neurological:  Positive for headaches. Negative for weakness.  Psychiatric/Behavioral:  Positive for agitation.        Anxious      Objective:   Physical Exam Constitutional:      General: She is not in acute distress.    Appearance: Normal appearance. She is obese. She is not ill-appearing or toxic-appearing.  HENT:     Head: Normocephalic.  Cardiovascular:     Rate and Rhythm: Normal rate and regular rhythm.     Pulses: Normal pulses.     Heart sounds: Normal  heart sounds. No murmur heard. Pulmonary:     Effort: Pulmonary effort is normal. No respiratory distress.     Breath sounds: Normal breath sounds. No wheezing.  Abdominal:     General: Abdomen is protuberant. A surgical scar is present. Bowel sounds are normal.     Palpations: Abdomen is soft. There is mass. There is no shifting dullness, fluid wave, hepatomegaly, splenomegaly or pulsatile mass.     Tenderness: There is no abdominal tenderness.     Hernia: A hernia is present. Hernia is present in the ventral  area.     Comments: Large ventral hernia ~14cm x 9cm located to lower abd.   Musculoskeletal:        General: Normal range of motion.     Right upper arm: No swelling, edema, deformity, lacerations, tenderness or bony tenderness.     Left upper arm: Normal.     Cervical back: Normal range of motion and neck supple. No rigidity or tenderness.     Comments: R arm: No tenderness to palpitation. No medial or lateral epicondyle tenderness. No deformities. No redness. ROM intact. No tingling or weakness noted.  Lymphadenopathy:     Cervical: No cervical adenopathy.  Skin:    General: Skin is warm.  Neurological:     General: No focal deficit present.     Mental Status: She is alert and oriented to person, place, and time.  Psychiatric:        Mood and Affect: Mood normal.        Behavior: Behavior normal.          Assessment & Plan:   1. Wellness examination Adult wellness-complete.wellness physical was conducted today. Importance of diet and exercise were discussed in detail.  In addition to this a discussion regarding safety was also covered. We also reviewed over immunizations and gave recommendations regarding current immunization needed for age.  In addition to this additional areas were also touched on including: Preventative health exams needed:  COVID vax due TDAP due PAP smear due Flu vax due Mammogram UTD Eye exam UTD Dental Exam UTD  Patient was advised yearly wellness exam  - Immunizations and PAP to be discussed at next visit in 4 weeks.   2. Morbid obesity due to excess calories ([place]) - Eat well balanced meals and exercise ~30 mins a day for 3-5x week. - CBC with Differential - HgB A1c - CMP14+EGFR - Lipid Panel With LDL/HDL Ratio  3. Nonintractable headache, unspecified chronicity pattern, unspecified headache type - Likely r/t stress or anxiety. - Headaches relieved after waking up. - RTC if headaches worsen or become more frequent or not easily  relieved. - If headaches not better with buproprion will consider a sleep study.  4. Anxiety - TSH - Discussed benefits and side effects of SSRIs and Prozac in detail. Patient concerned about possibility of low libido.  - Will start Bupropion 164m daily x4 weeks. Discussed that buproprion may or may not help with anxiety but should not effect libido. Patient willing to try for 4 weeks. - Discussed risks of benzodiazepines (Xanax). Xanax not recommended.  - Discussed CBT and encouraged patient to read Feeling Good by Dr. BQuay Burow  - Patient was offered therapy/counseling referral but rather try the book first.  - RTC in 4 weeks   5. Fatigue, unspecified type - Likely r/t anxiety however thyroid and anemia etiologies considered. - TSH - CBC  6. Arm pain, lateral, right - Possible myofascial pain - Stretch right arm -  Continue taking Ibuprofen as needed for pain - Patient continues or becomes work, RTC to be re-evaluated.  7. Ventral hernia without obstruction or gangrene - Likely uncomplicated hernia - Ambulatory referral to General Surgery for opinion

## 2021-04-04 LAB — HEMOGLOBIN A1C
Est. average glucose Bld gHb Est-mCnc: 120 mg/dL
Hgb A1c MFr Bld: 5.8 % — ABNORMAL HIGH (ref 4.8–5.6)

## 2021-04-04 LAB — CBC WITH DIFFERENTIAL/PLATELET
Basophils Absolute: 0.1 10*3/uL (ref 0.0–0.2)
Basos: 1 %
EOS (ABSOLUTE): 0.1 10*3/uL (ref 0.0–0.4)
Eos: 1 %
Hematocrit: 42.9 % (ref 34.0–46.6)
Hemoglobin: 14 g/dL (ref 11.1–15.9)
Immature Grans (Abs): 0 10*3/uL (ref 0.0–0.1)
Immature Granulocytes: 0 %
Lymphocytes Absolute: 3.4 10*3/uL — ABNORMAL HIGH (ref 0.7–3.1)
Lymphs: 29 %
MCH: 29.4 pg (ref 26.6–33.0)
MCHC: 32.6 g/dL (ref 31.5–35.7)
MCV: 90 fL (ref 79–97)
Monocytes Absolute: 0.7 10*3/uL (ref 0.1–0.9)
Monocytes: 6 %
Neutrophils Absolute: 7.3 10*3/uL — ABNORMAL HIGH (ref 1.4–7.0)
Neutrophils: 63 %
Platelets: 431 10*3/uL (ref 150–450)
RBC: 4.77 x10E6/uL (ref 3.77–5.28)
RDW: 13.2 % (ref 11.7–15.4)
WBC: 11.7 10*3/uL — ABNORMAL HIGH (ref 3.4–10.8)

## 2021-04-04 LAB — CMP14+EGFR
ALT: 19 IU/L (ref 0–32)
AST: 14 IU/L (ref 0–40)
Albumin/Globulin Ratio: 1.4 (ref 1.2–2.2)
Albumin: 4.2 g/dL (ref 3.8–4.8)
Alkaline Phosphatase: 52 IU/L (ref 44–121)
BUN/Creatinine Ratio: 16 (ref 9–23)
BUN: 13 mg/dL (ref 6–24)
Bilirubin Total: 0.4 mg/dL (ref 0.0–1.2)
CO2: 25 mmol/L (ref 20–29)
Calcium: 9.4 mg/dL (ref 8.7–10.2)
Chloride: 100 mmol/L (ref 96–106)
Creatinine, Ser: 0.82 mg/dL (ref 0.57–1.00)
Globulin, Total: 2.9 g/dL (ref 1.5–4.5)
Glucose: 93 mg/dL (ref 70–99)
Potassium: 3.9 mmol/L (ref 3.5–5.2)
Sodium: 139 mmol/L (ref 134–144)
Total Protein: 7.1 g/dL (ref 6.0–8.5)
eGFR: 92 mL/min/{1.73_m2} (ref >59)

## 2021-04-04 LAB — TSH: TSH: 1.46 u[IU]/mL (ref 0.450–4.500)

## 2021-04-04 LAB — SPECIMEN STATUS REPORT

## 2021-04-05 LAB — LIPID PANEL WITH LDL/HDL RATIO
Cholesterol, Total: 180 mg/dL (ref 100–199)
HDL: 41 mg/dL (ref >39)
LDL Chol Calc (NIH): 123 mg/dL — ABNORMAL HIGH (ref 0–99)
LDL/HDL Ratio: 3 ratio (ref 0.0–3.2)
Triglycerides: 87 mg/dL (ref 0–149)
VLDL Cholesterol Cal: 16 mg/dL (ref 5–40)

## 2021-04-05 LAB — SPECIMEN STATUS REPORT

## 2021-04-05 NOTE — Progress Notes (Signed)
Can you call the patient with the following message:  Overall labs look pretty good. Thyroid hormone normal, blood counts with in normal limits, kidney and liver labs looks good.   Your LDL (bad cholesterol) is a little high. However, your risk for cardiovascular risk is in low at this time (10 year ASCVD score = 0.9%). No need to start medication at this point. Eating a well balanced diet with plenty of lean meats, plenty of vegetables and exercising ~30 mins a day for 3-5 days week will help your cholesterol level.   Your A1C is in prediabetic range, but again not at the point to where you need medication at this time. This can also be managed with diet and exercise.   Side note: After reviewing your chart and thinking about your hernia, I have decided not to order any imaging. Instead I will get you in with General Surgery and let them order the necessary imaging to prevent any duplications of imaging.   Let me know if you have any questions.

## 2021-04-13 ENCOUNTER — Ambulatory Visit: Payer: BC Managed Care – PPO | Admitting: Surgery

## 2021-05-01 ENCOUNTER — Ambulatory Visit: Payer: BC Managed Care – PPO | Admitting: Nurse Practitioner

## 2021-05-01 ENCOUNTER — Encounter: Payer: Self-pay | Admitting: Nurse Practitioner

## 2021-09-08 DIAGNOSIS — Z79899 Other long term (current) drug therapy: Secondary | ICD-10-CM | POA: Diagnosis not present

## 2021-09-08 DIAGNOSIS — R5381 Other malaise: Secondary | ICD-10-CM | POA: Diagnosis not present

## 2021-09-08 DIAGNOSIS — Z13220 Encounter for screening for lipoid disorders: Secondary | ICD-10-CM | POA: Diagnosis not present

## 2021-09-08 DIAGNOSIS — Z1329 Encounter for screening for other suspected endocrine disorder: Secondary | ICD-10-CM | POA: Diagnosis not present

## 2021-09-08 DIAGNOSIS — Z136 Encounter for screening for cardiovascular disorders: Secondary | ICD-10-CM | POA: Diagnosis not present

## 2021-09-08 DIAGNOSIS — Z7689 Persons encountering health services in other specified circumstances: Secondary | ICD-10-CM | POA: Diagnosis not present

## 2021-09-08 DIAGNOSIS — E569 Vitamin deficiency, unspecified: Secondary | ICD-10-CM | POA: Diagnosis not present

## 2021-09-08 DIAGNOSIS — Z6834 Body mass index (BMI) 34.0-34.9, adult: Secondary | ICD-10-CM | POA: Diagnosis not present

## 2021-09-08 DIAGNOSIS — Z131 Encounter for screening for diabetes mellitus: Secondary | ICD-10-CM | POA: Diagnosis not present

## 2021-09-08 DIAGNOSIS — Z130 Encounter for screening for diseases of the blood and blood-forming organs and certain disorders involving the immune mechanism: Secondary | ICD-10-CM | POA: Diagnosis not present

## 2021-09-08 DIAGNOSIS — J302 Other seasonal allergic rhinitis: Secondary | ICD-10-CM | POA: Diagnosis not present

## 2021-11-10 DIAGNOSIS — F419 Anxiety disorder, unspecified: Secondary | ICD-10-CM | POA: Diagnosis not present

## 2021-11-10 DIAGNOSIS — D72829 Elevated white blood cell count, unspecified: Secondary | ICD-10-CM | POA: Diagnosis not present

## 2021-12-01 ENCOUNTER — Ambulatory Visit: Payer: BC Managed Care – PPO | Admitting: Adult Health

## 2022-01-19 ENCOUNTER — Ambulatory Visit: Payer: BC Managed Care – PPO | Admitting: Adult Health

## 2022-02-06 DIAGNOSIS — R519 Headache, unspecified: Secondary | ICD-10-CM | POA: Diagnosis not present

## 2022-02-14 DIAGNOSIS — G43119 Migraine with aura, intractable, without status migrainosus: Secondary | ICD-10-CM | POA: Diagnosis not present

## 2022-02-14 DIAGNOSIS — R030 Elevated blood-pressure reading, without diagnosis of hypertension: Secondary | ICD-10-CM | POA: Diagnosis not present

## 2022-02-26 DIAGNOSIS — R079 Chest pain, unspecified: Secondary | ICD-10-CM | POA: Diagnosis not present

## 2022-02-26 DIAGNOSIS — G5622 Lesion of ulnar nerve, left upper limb: Secondary | ICD-10-CM | POA: Diagnosis not present

## 2022-02-26 DIAGNOSIS — M436 Torticollis: Secondary | ICD-10-CM | POA: Diagnosis not present

## 2022-02-26 DIAGNOSIS — R030 Elevated blood-pressure reading, without diagnosis of hypertension: Secondary | ICD-10-CM | POA: Diagnosis not present

## 2022-04-02 ENCOUNTER — Other Ambulatory Visit: Payer: Self-pay | Admitting: Surgery

## 2022-04-02 DIAGNOSIS — K432 Incisional hernia without obstruction or gangrene: Secondary | ICD-10-CM

## 2022-04-24 ENCOUNTER — Ambulatory Visit
Admission: RE | Admit: 2022-04-24 | Discharge: 2022-04-24 | Disposition: A | Discharge status: Home / Self Care | Payer: BC Managed Care – PPO | Source: Ambulatory Visit | Attending: Surgery | Admitting: Surgery

## 2022-04-24 DIAGNOSIS — K432 Incisional hernia without obstruction or gangrene: Secondary | ICD-10-CM

## 2022-04-24 MED ORDER — IOPAMIDOL (ISOVUE-300) INJECTION 61%
100.0000 mL | Freq: Once | INTRAVENOUS | Status: AC | PRN
  Administered 2022-04-24: 100 mL via INTRAVENOUS

## 2022-04-27 NOTE — Progress Notes (Signed)
CT scan reviewed.  I am going to ask Dr. Thermon Leyland to review to see if she would be a candidate for laparoscopic or robotic repair.  tmg  Armandina Gemma, MD [facility] [place] [facility] Office: [phone]

## 2023-10-25 ENCOUNTER — Ambulatory Visit: Payer: Self-pay | Admitting: Surgery

## 2023-11-07 NOTE — Patient Instructions (Addendum)
 SURGICAL WAITING ROOM VISITATION Patients having surgery or a procedure may have no more than 2 support people in the waiting area - these visitors may rotate.    Children under the age of 16 must have an adult with them who is not the patient.  If the patient needs to stay at the hospital during part of their recovery, the visitor guidelines for inpatient rooms apply. Pre-op nurse will coordinate an appropriate time for 1 support person to accompany patient in pre-op.  This support person may not rotate.    Please refer to the [facility] website for the visitor guidelines for Inpatients (after your surgery is over and you are in a regular room).      Your procedure is scheduled on: 11-18-23   Report to [facility] Main Entrance    Report to admitting at 8:45 AM   Call this number if you have problems the morning of surgery [phone]   Do not eat food :After Midnight.   After Midnight you may have the following liquids until 8:00 AM DAY OF SURGERY  Water Non-Citrus Juices (without pulp, NO RED-Apple, White grape, White cranberry) Black Coffee (NO MILK/CREAM OR CREAMERS, sugar ok)  Clear Tea (NO MILK/CREAM OR CREAMERS, sugar ok) regular and decaf                             Plain Jell-O (NO RED)                                           Fruit ices (not with fruit pulp, NO RED)                                     Popsicles (NO RED)                                                               Sports drinks like Gatorade (NO RED)                      If you have questions, please contact your surgeon's office.   FOLLOW  ANY ADDITIONAL PRE OP INSTRUCTIONS YOU RECEIVED FROM YOUR SURGEON'S OFFICE!!!     Oral Hygiene is also important to reduce your risk of infection.                                    Remember - BRUSH YOUR TEETH THE MORNING OF SURGERY WITH YOUR REGULAR TOOTHPASTE   Do NOT smoke after Midnight   Take these medicines the morning of surgery with A SIP OF  WATER:    None  Stop all vitamins and herbal supplements 7 days before surgery                             You may not have any metal on your body including hair pins, jewelry, and body piercing  Do not wear make-up, lotions, powders, perfumes or deodorant  Do not wear nail polish including gel and S&S, artificial/acrylic nails, or any other type of covering on natural nails including finger and toenails. If you have artificial nails, gel coating, etc. that needs to be removed by a nail salon please have this removed prior to surgery or surgery may need to be canceled/ delayed if the surgeon/ anesthesia feels like they are unable to be safely monitored.   Do not shave  48 hours prior to surgery.      Do not bring valuables to the hospital. [facility] IS NOT RESPONSIBLE   FOR VALUABLES.   Contacts, dentures or bridgework may not be worn into surgery.   Bring small overnight bag day of surgery.   DO NOT BRING YOUR HOME MEDICATIONS TO THE HOSPITAL. PHARMACY WILL DISPENSE MEDICATIONS LISTED ON YOUR MEDICATION LIST TO YOU DURING YOUR ADMISSION IN THE HOSPITAL!               Please read over the following fact sheets you were given: IF YOU HAVE QUESTIONS ABOUT YOUR PRE-OP INSTRUCTIONS PLEASE CALL [phone] [facility]  If you received a COVID test during your pre-op visit  it is requested that you wear a mask when out in public, stay away from anyone that may not be feeling well and notify your surgeon if you develop symptoms. If you test positive for Covid or have been in contact with anyone that has tested positive in the last 10 days please notify you surgeon.  [place] - Preparing for Surgery Before surgery, you can play an important role.  Because skin is not sterile, your skin needs to be as free of germs as possible.  You can reduce the number of germs on your skin by washing with CHG (chlorahexidine gluconate) soap before surgery.  CHG is an antiseptic cleaner which kills  germs and bonds with the skin to continue killing germs even after washing. Please DO NOT use if you have an allergy to CHG or antibacterial soaps.  If your skin becomes reddened/irritated stop using the CHG and inform your nurse when you arrive at Short Stay. Do not shave (including legs and underarms) for at least 48 hours prior to the first CHG shower.  You may shave your face/neck.  Please follow these instructions carefully:  1.  Shower with CHG Soap the night before surgery and the  morning of surgery.  2.  If you choose to wash your hair, wash your hair first as usual with your normal  shampoo.  3.  After you shampoo, rinse your hair and body thoroughly to remove the shampoo.                             4.  Use CHG as you would any other liquid soap.  You can apply chg directly to the skin and wash.  Gently with a scrungie or clean washcloth.  5.  Apply the CHG Soap to your body ONLY FROM THE NECK DOWN.   Do   not use on face/ open                           Wound or open sores. Avoid contact with eyes, ears mouth and   genitals (private parts).  Wash face,  Genitals (private parts) with your normal soap.             6.  Wash thoroughly, paying special attention to the area where your    surgery  will be performed.  7.  Thoroughly rinse your body with warm water from the neck down.  8.  DO NOT shower/wash with your normal soap after using and rinsing off the CHG Soap.                9.  Pat yourself dry with a clean towel.            10.  Wear clean pajamas.            11.  Place clean sheets on your bed the night of your first shower and do not  sleep with pets. Day of Surgery : Do not apply any lotions/deodorants the morning of surgery.  Please wear clean clothes to the hospital/surgery center.  FAILURE TO FOLLOW THESE INSTRUCTIONS MAY RESULT IN THE CANCELLATION OF YOUR SURGERY  PATIENT SIGNATURE_________________________________  NURSE  SIGNATURE__________________________________  ________________________________________________________________________

## 2023-11-07 NOTE — Progress Notes (Addendum)
  Date of COVID positive in last 90 days: No  PCP - Efrain Broaden, NP Cardiologist -  N/A  Chest x-ray - N/A EKG - N/A Stress Test - N/A ECHO - N/A Cardiac Cath -  Pacemaker/ICD device last checked:N/A Spinal Cord Stimulator:N/A  Bowel Prep - N/A  Sleep Study - Yes, neg sleep apnea CPAP -   Fasting Blood Sugar - N/A Checks Blood Sugar _____ times a day  Last dose of GLP1 agonist-  N/A GLP1 instructions:  Do not take after     Last dose of SGLT-2 inhibitors-  N/A SGLT-2 instructions:  Do not take after    Blood Thinner Instructions: N/A  Aspirin Instructions:N/A Last Dose:  Activity level:  Can go up a flight of stairs and perform activities of daily living without stopping and without symptoms of chest pain or shortness of breath.  Anesthesia review: N/A  Patient denies shortness of breath, fever, cough and chest pain at PAT appointment  Patient verbalized understanding of instructions that were given to them at the PAT appointment. Patient was also instructed that they will need to review over the PAT instructions again at home before surgery.

## 2023-11-08 ENCOUNTER — Encounter (HOSPITAL_COMMUNITY)
Admission: RE | Admit: 2023-11-08 | Discharge: 2023-11-08 | Disposition: A | Discharge status: Home / Self Care | Source: Ambulatory Visit | Attending: Surgery | Admitting: Surgery

## 2023-11-08 ENCOUNTER — Encounter (HOSPITAL_COMMUNITY): Payer: Self-pay

## 2023-11-08 ENCOUNTER — Other Ambulatory Visit: Payer: Self-pay

## 2023-11-08 VITALS — BP 142/89 | HR 80 | Temp 98.6°F | Resp 12 | Ht 63.0 in | Wt 193.6 lb

## 2023-11-08 DIAGNOSIS — Z01818 Encounter for other preprocedural examination: Secondary | ICD-10-CM

## 2023-11-08 DIAGNOSIS — Z01812 Encounter for preprocedural laboratory examination: Secondary | ICD-10-CM | POA: Diagnosis not present

## 2023-11-08 HISTORY — DX: Migraine, unspecified, not intractable, without status migrainosus: G43.909

## 2023-11-08 HISTORY — DX: Other specified postprocedural states: Z98.890

## 2023-11-08 LAB — CBC
HCT: 43.3 % (ref 36.0–46.0)
Hemoglobin: 13.9 g/dL (ref 12.0–15.0)
MCH: 28.8 pg (ref 26.0–34.0)
MCHC: 32.1 g/dL (ref 30.0–36.0)
MCV: 89.8 fL (ref 80.0–100.0)
Platelets: 405 K/uL — ABNORMAL HIGH (ref 150–400)
RBC: 4.82 MIL/uL (ref 3.87–5.11)
RDW: 13.2 % (ref 11.5–15.5)
WBC: 11.9 K/uL — ABNORMAL HIGH (ref 4.0–10.5)
nRBC: 0 % (ref 0.0–0.2)

## 2023-11-18 ENCOUNTER — Encounter (HOSPITAL_COMMUNITY)
Admission: RE | Disposition: A | Discharge status: Home / Self Care | Payer: Self-pay | Source: Home / Self Care | Attending: Surgery

## 2023-11-18 ENCOUNTER — Encounter (HOSPITAL_COMMUNITY): Payer: Self-pay | Admitting: Surgery

## 2023-11-18 ENCOUNTER — Ambulatory Visit (HOSPITAL_COMMUNITY)
Admission: RE | Admit: 2023-11-18 | Discharge: 2023-11-19 | Disposition: A | Discharge status: Home / Self Care | Attending: Surgery | Admitting: Surgery

## 2023-11-18 ENCOUNTER — Ambulatory Visit (HOSPITAL_COMMUNITY): Payer: Self-pay | Admitting: Medical

## 2023-11-18 ENCOUNTER — Other Ambulatory Visit: Payer: Self-pay

## 2023-11-18 ENCOUNTER — Ambulatory Visit (HOSPITAL_BASED_OUTPATIENT_CLINIC_OR_DEPARTMENT_OTHER): Admitting: Anesthesiology

## 2023-11-18 DIAGNOSIS — K439 Ventral hernia without obstruction or gangrene: Secondary | ICD-10-CM | POA: Diagnosis present

## 2023-11-18 DIAGNOSIS — Z01818 Encounter for other preprocedural examination: Secondary | ICD-10-CM

## 2023-11-18 DIAGNOSIS — K432 Incisional hernia without obstruction or gangrene: Secondary | ICD-10-CM | POA: Insufficient documentation

## 2023-11-18 DIAGNOSIS — K429 Umbilical hernia without obstruction or gangrene: Secondary | ICD-10-CM | POA: Insufficient documentation

## 2023-11-18 HISTORY — PX: EPIGASTRIC HERNIA REPAIR: SHX404

## 2023-11-18 HISTORY — PX: XI ROBOTIC ASSISTED VENTRAL HERNIA: SHX6789

## 2023-11-18 LAB — CBC
HCT: 39.7 % (ref 36.0–46.0)
Hemoglobin: 13.2 g/dL (ref 12.0–15.0)
MCH: 29.8 pg (ref 26.0–34.0)
MCHC: 33.2 g/dL (ref 30.0–36.0)
MCV: 89.6 fL (ref 80.0–100.0)
Platelets: 377 K/uL (ref 150–400)
RBC: 4.43 MIL/uL (ref 3.87–5.11)
RDW: 13.2 % (ref 11.5–15.5)
WBC: 19.5 K/uL — ABNORMAL HIGH (ref 4.0–10.5)
nRBC: 0 % (ref 0.0–0.2)

## 2023-11-18 LAB — CREATININE, SERUM
Creatinine, Ser: 0.61 mg/dL (ref 0.44–1.00)
GFR, Estimated: >60 mL/min (ref >60)

## 2023-11-18 SURGERY — REPAIR, HERNIA, VENTRAL, ROBOT-ASSISTED
Anesthesia: General | Site: Abdomen

## 2023-11-18 MED ORDER — ACETAMINOPHEN 500 MG PO TABS
1000.0000 mg | ORAL_TABLET | ORAL | Status: AC
  Administered 2023-11-18: 1000 mg via ORAL
  Filled 2023-11-18: qty 2

## 2023-11-18 MED ORDER — ONDANSETRON HCL 4 MG/2ML IJ SOLN
INTRAMUSCULAR | Status: DC | PRN
  Administered 2023-11-18: 4 mg via INTRAVENOUS

## 2023-11-18 MED ORDER — KETOROLAC TROMETHAMINE 15 MG/ML IJ SOLN
15.0000 mg | INTRAMUSCULAR | Status: AC
  Filled 2023-11-18: qty 1

## 2023-11-18 MED ORDER — PROPOFOL 10 MG/ML IV BOLUS
INTRAVENOUS | Status: DC | PRN
  Administered 2023-11-18: 200 mg via INTRAVENOUS
  Administered 2023-11-18: 100 ug/kg/min via INTRAVENOUS

## 2023-11-18 MED ORDER — ONDANSETRON HCL 4 MG/2ML IJ SOLN
INTRAMUSCULAR | Status: AC
  Filled 2023-11-18: qty 2

## 2023-11-18 MED ORDER — LIDOCAINE HCL (PF) 2 % IJ SOLN
INTRAMUSCULAR | Status: AC
  Filled 2023-11-18: qty 5

## 2023-11-18 MED ORDER — GABAPENTIN 300 MG PO CAPS
300.0000 mg | ORAL_CAPSULE | ORAL | Status: AC
  Administered 2023-11-18: 300 mg via ORAL
  Filled 2023-11-18: qty 1

## 2023-11-18 MED ORDER — ROCURONIUM BROMIDE 100 MG/10ML IV SOLN
INTRAVENOUS | Status: DC | PRN
  Administered 2023-11-18: 60 mg via INTRAVENOUS
  Administered 2023-11-18: 20 mg via INTRAVENOUS
  Administered 2023-11-18: 10 mg via INTRAVENOUS

## 2023-11-18 MED ORDER — EPHEDRINE 5 MG/ML INJ
INTRAVENOUS | Status: AC
  Filled 2023-11-18: qty 5

## 2023-11-18 MED ORDER — PROPOFOL 1000 MG/100ML IV EMUL
INTRAVENOUS | Status: AC
  Filled 2023-11-18: qty 100

## 2023-11-18 MED ORDER — PROPOFOL 10 MG/ML IV BOLUS
INTRAVENOUS | Status: AC
  Filled 2023-11-18: qty 20

## 2023-11-18 MED ORDER — 0.9 % SODIUM CHLORIDE (POUR BTL) OPTIME
TOPICAL | Status: DC | PRN
  Administered 2023-11-18: 1000 mL

## 2023-11-18 MED ORDER — OXYCODONE HCL 5 MG PO TABS
10.0000 mg | ORAL_TABLET | ORAL | Status: DC | PRN
  Administered 2023-11-18: 10 mg via ORAL
  Filled 2023-11-18: qty 2

## 2023-11-18 MED ORDER — OXYCODONE HCL 5 MG PO TABS: 5.0000 mg | ORAL_TABLET | Freq: Once | ORAL | Status: DC | PRN

## 2023-11-18 MED ORDER — FENTANYL CITRATE (PF) 100 MCG/2ML IJ SOLN
INTRAMUSCULAR | Status: DC | PRN
  Administered 2023-11-18: 25 ug via INTRAVENOUS
  Administered 2023-11-18: 75 ug via INTRAVENOUS

## 2023-11-18 MED ORDER — HYDROMORPHONE HCL 1 MG/ML IJ SOLN
INTRAMUSCULAR | Status: AC
  Filled 2023-11-18: qty 2

## 2023-11-18 MED ORDER — CHLORHEXIDINE GLUCONATE 0.12 % MT SOLN
15.0000 mL | Freq: Once | OROMUCOSAL | Status: AC
  Administered 2023-11-18: 15 mL via OROMUCOSAL

## 2023-11-18 MED ORDER — SIMETHICONE 80 MG PO CHEW: 80.0000 mg | CHEWABLE_TABLET | Freq: Four times a day (QID) | ORAL | Status: DC | PRN

## 2023-11-18 MED ORDER — MIDAZOLAM HCL 5 MG/5ML IJ SOLN
INTRAMUSCULAR | Status: DC | PRN
  Administered 2023-11-18: 2 mg via INTRAVENOUS

## 2023-11-18 MED ORDER — SUGAMMADEX SODIUM 200 MG/2ML IV SOLN
INTRAVENOUS | Status: AC
  Filled 2023-11-18: qty 2

## 2023-11-18 MED ORDER — CHLORHEXIDINE GLUCONATE CLOTH 2 % EX PADS: 6.0000 | MEDICATED_PAD | Freq: Once | CUTANEOUS | Status: DC

## 2023-11-18 MED ORDER — ACETAMINOPHEN 325 MG PO TABS
650.0000 mg | ORAL_TABLET | Freq: Four times a day (QID) | ORAL | Status: DC
  Administered 2023-11-18 – 2023-11-19 (×4): 650 mg via ORAL
  Filled 2023-11-18 (×4): qty 2

## 2023-11-18 MED ORDER — CEFAZOLIN SODIUM-DEXTROSE 2-4 GM/100ML-% IV SOLN
2.0000 g | INTRAVENOUS | Status: AC
  Administered 2023-11-18: 2 g via INTRAVENOUS
  Filled 2023-11-18: qty 100

## 2023-11-18 MED ORDER — MEPERIDINE HCL 100 MG/ML IJ SOLN: 6.2500 mg | INTRAMUSCULAR | Status: DC | PRN

## 2023-11-18 MED ORDER — HYDROMORPHONE HCL 1 MG/ML IJ SOLN
0.2500 mg | INTRAMUSCULAR | Status: DC | PRN
  Administered 2023-11-18 (×2): 0.5 mg via INTRAVENOUS

## 2023-11-18 MED ORDER — ROCURONIUM BROMIDE 10 MG/ML (PF) SYRINGE
PREFILLED_SYRINGE | INTRAVENOUS | Status: AC
  Filled 2023-11-18: qty 10

## 2023-11-18 MED ORDER — HYDROMORPHONE HCL 1 MG/ML IJ SOLN: 0.5000 mg | INTRAMUSCULAR | Status: DC | PRN

## 2023-11-18 MED ORDER — KETOROLAC TROMETHAMINE 15 MG/ML IJ SOLN
15.0000 mg | Freq: Three times a day (TID) | INTRAMUSCULAR | Status: DC
  Administered 2023-11-18 – 2023-11-19 (×3): 15 mg via INTRAVENOUS
  Filled 2023-11-18 (×3): qty 1

## 2023-11-18 MED ORDER — BUPIVACAINE-EPINEPHRINE (PF) 0.25% -1:200000 IJ SOLN
INTRAMUSCULAR | Status: AC
  Filled 2023-11-18: qty 60

## 2023-11-18 MED ORDER — DIPHENHYDRAMINE HCL 50 MG/ML IJ SOLN
INTRAMUSCULAR | Status: AC
  Filled 2023-11-18: qty 1

## 2023-11-18 MED ORDER — PROCHLORPERAZINE EDISYLATE 10 MG/2ML IJ SOLN: 10.0000 mg | Freq: Four times a day (QID) | INTRAMUSCULAR | Status: DC | PRN

## 2023-11-18 MED ORDER — ENOXAPARIN SODIUM 40 MG/0.4ML IJ SOSY
40.0000 mg | PREFILLED_SYRINGE | INTRAMUSCULAR | Status: DC
  Administered 2023-11-19: 40 mg via SUBCUTANEOUS
  Filled 2023-11-18 (×2): qty 0.4

## 2023-11-18 MED ORDER — BUPIVACAINE-EPINEPHRINE 0.25% -1:200000 IJ SOLN
INTRAMUSCULAR | Status: DC | PRN
  Administered 2023-11-18: 30 mL

## 2023-11-18 MED ORDER — OXYCODONE HCL 5 MG/5ML PO SOLN: 5.0000 mg | Freq: Once | ORAL | Status: DC | PRN

## 2023-11-18 MED ORDER — PHENYLEPHRINE 80 MCG/ML (10ML) SYRINGE FOR IV PUSH (FOR BLOOD PRESSURE SUPPORT)
PREFILLED_SYRINGE | INTRAVENOUS | Status: AC
  Filled 2023-11-18: qty 10

## 2023-11-18 MED ORDER — DIPHENHYDRAMINE HCL 50 MG/ML IJ SOLN
INTRAMUSCULAR | Status: DC | PRN
  Administered 2023-11-18: 12.5 mg via INTRAVENOUS

## 2023-11-18 MED ORDER — MIDAZOLAM HCL 2 MG/2ML IJ SOLN: 0.5000 mg | Freq: Once | INTRAMUSCULAR | Status: DC | PRN

## 2023-11-18 MED ORDER — SCOPOLAMINE 1 MG/3DAYS TD PT72
1.0000 | MEDICATED_PATCH | Freq: Once | TRANSDERMAL | Status: DC
  Administered 2023-11-18: 1 mg via TRANSDERMAL
  Filled 2023-11-18: qty 1

## 2023-11-18 MED ORDER — ORAL CARE MOUTH RINSE: 15.0000 mL | Freq: Once | OROMUCOSAL | Status: AC

## 2023-11-18 MED ORDER — DEXAMETHASONE SODIUM PHOSPHATE 10 MG/ML IJ SOLN
INTRAMUSCULAR | Status: DC | PRN
Start: 2023-11-18 — End: 2023-11-18
  Administered 2023-11-18: 8 mg via INTRAVENOUS

## 2023-11-18 MED ORDER — SUGAMMADEX SODIUM 200 MG/2ML IV SOLN
INTRAVENOUS | Status: DC | PRN
Start: 2023-11-18 — End: 2023-11-18
  Administered 2023-11-18: 200 mg via INTRAVENOUS

## 2023-11-18 MED ORDER — LACTATED RINGERS IV SOLN: INTRAVENOUS | Status: DC

## 2023-11-18 MED ORDER — GABAPENTIN 300 MG PO CAPS
300.0000 mg | ORAL_CAPSULE | Freq: Three times a day (TID) | ORAL | Status: DC
  Administered 2023-11-18 – 2023-11-19 (×3): 300 mg via ORAL
  Filled 2023-11-18 (×3): qty 1

## 2023-11-18 MED ORDER — ONDANSETRON HCL 4 MG/2ML IJ SOLN: 4.0000 mg | Freq: Four times a day (QID) | INTRAMUSCULAR | Status: DC | PRN

## 2023-11-18 MED ORDER — FENTANYL CITRATE (PF) 100 MCG/2ML IJ SOLN
INTRAMUSCULAR | Status: AC
  Filled 2023-11-18: qty 2

## 2023-11-18 MED ORDER — DOCUSATE SODIUM 100 MG PO CAPS
100.0000 mg | ORAL_CAPSULE | Freq: Two times a day (BID) | ORAL | Status: DC
  Administered 2023-11-18 – 2023-11-19 (×2): 100 mg via ORAL
  Filled 2023-11-18 (×2): qty 1

## 2023-11-18 MED ORDER — KETOROLAC TROMETHAMINE 15 MG/ML IJ SOLN
INTRAMUSCULAR | Status: AC
  Administered 2023-11-18: 15 mg via INTRAVENOUS
  Filled 2023-11-18: qty 1

## 2023-11-18 MED ORDER — OXYCODONE HCL 5 MG PO TABS: 5.0000 mg | ORAL_TABLET | ORAL | Status: DC | PRN

## 2023-11-18 MED ORDER — DEXAMETHASONE SODIUM PHOSPHATE 10 MG/ML IJ SOLN
INTRAMUSCULAR | Status: AC
  Filled 2023-11-18: qty 1

## 2023-11-18 MED ORDER — METHOCARBAMOL 1000 MG/10ML IJ SOLN: 500.0000 mg | Freq: Four times a day (QID) | INTRAMUSCULAR | Status: DC | PRN

## 2023-11-18 MED ORDER — MIDAZOLAM HCL 2 MG/2ML IJ SOLN
INTRAMUSCULAR | Status: AC
Start: 2023-11-18 — End: 2023-11-18
  Filled 2023-11-18: qty 2

## 2023-11-18 SURGICAL SUPPLY — 71 items
BAG COUNTER SPONGE SURGICOUNT (BAG) ×2 IMPLANT
BINDER ABDOMINAL 12 ML 46-62 (SOFTGOODS) IMPLANT
BLADE SURG SZ11 CARB STEEL (BLADE) ×2 IMPLANT
CHLORAPREP W/TINT 26 (MISCELLANEOUS) ×2 IMPLANT
COVER MAYO STAND STRL (DRAPES) ×2 IMPLANT
COVER SURGICAL LIGHT HANDLE (MISCELLANEOUS) ×2 IMPLANT
COVER TIP SHEARS 8 DVNC (MISCELLANEOUS) ×2 IMPLANT
DERMABOND ADVANCED .7 DNX12 (GAUZE/BANDAGES/DRESSINGS) ×2 IMPLANT
DEVICE TROCAR PUNCTURE CLOSURE (ENDOMECHANICALS) IMPLANT
DRAIN CHANNEL 19F RND (DRAIN) IMPLANT
DRAPE ARM DVNC X/XI (DISPOSABLE) ×6 IMPLANT
DRAPE COLUMN DVNC XI (DISPOSABLE) ×2 IMPLANT
DRAPE LAPAROSCOPIC ABDOMINAL (DRAPES) ×2 IMPLANT
DRAPE WARM FLUID 44X44 (DRAPES) ×2 IMPLANT
DRIVER NDL LRG 8 DVNC XI (INSTRUMENTS) ×4 IMPLANT
DRIVER NDL MEGA SUTCUT DVNCXI (INSTRUMENTS) ×4 IMPLANT
DRIVER NDLE LRG 8 DVNC XI (INSTRUMENTS) IMPLANT
DRIVER NDLE MEGA SUTCUT DVNCXI (INSTRUMENTS) ×1 IMPLANT
ELECT BLADE TIP CTD 4 INCH (ELECTRODE) ×2 IMPLANT
ELECT PENCIL ROCKER SW 15FT (MISCELLANEOUS) ×2 IMPLANT
ELECT REM PT RETURN 15FT ADLT (MISCELLANEOUS) ×2 IMPLANT
ELECTRODE L-HOOK LAP 45CM DISP (ELECTROSURGICAL) ×2 IMPLANT
EVACUATOR SILICONE 100CC (DRAIN) IMPLANT
FORCEPS PROGRASP DVNC XI (FORCEP) ×2 IMPLANT
GAUZE 4X4 16PLY ~~LOC~~+RFID DBL (SPONGE) ×2 IMPLANT
GAUZE SPONGE 4X4 12PLY STRL (GAUZE/BANDAGES/DRESSINGS) ×2 IMPLANT
GLOVE BIO SURGEON STRL SZ7.5 (GLOVE) ×4 IMPLANT
GLOVE INDICATOR 8.0 STRL GRN (GLOVE) ×4 IMPLANT
GOWN STRL REUS W/ TWL XL LVL3 (GOWN DISPOSABLE) ×4 IMPLANT
GRASPER SUT TROCAR 14GX15 (MISCELLANEOUS) IMPLANT
GRASPER TIP-UP FEN DVNC XI (INSTRUMENTS) ×2 IMPLANT
IRRIGATION SUCT STRKRFLW 2 WTP (MISCELLANEOUS) IMPLANT
KIT BASIN OR (CUSTOM PROCEDURE TRAY) ×2 IMPLANT
KIT TURNOVER KIT A (KITS) ×2 IMPLANT
MANIFOLD NEPTUNE II (INSTRUMENTS) ×2 IMPLANT
MARKER SKIN DUAL TIP RULER LAB (MISCELLANEOUS) ×2 IMPLANT
MESH SOFT 12X12IN BARD (Mesh General) IMPLANT
NDL SPNL 18GX3.5 QUINCKE PK (NEEDLE) ×2 IMPLANT
NEEDLE SPNL 18GX3.5 QUINCKE PK (NEEDLE) ×1 IMPLANT
OBTURATOR OPTICALSTD 8 DVNC (TROCAR) ×2 IMPLANT
PACK CARDIOVASCULAR III (CUSTOM PROCEDURE TRAY) ×2 IMPLANT
PACK GENERAL/GYN (CUSTOM PROCEDURE TRAY) ×2 IMPLANT
SCISSORS MNPLR CVD DVNC XI (INSTRUMENTS) ×2 IMPLANT
SEAL UNIV 5-12 XI (MISCELLANEOUS) ×6 IMPLANT
SET TUBE SMOKE EVAC HIGH FLOW (TUBING) ×2 IMPLANT
SOLUTION ANTFG W/FOAM PAD STRL (MISCELLANEOUS) ×2 IMPLANT
SOLUTION ELECTROSURG ANTI STCK (MISCELLANEOUS) ×2 IMPLANT
SPIKE FLUID TRANSFER (MISCELLANEOUS) ×2 IMPLANT
SPONGE DRAIN TRACH 4X4 STRL 2S (GAUZE/BANDAGES/DRESSINGS) IMPLANT
SPONGE T-LAP 18X18 ~~LOC~~+RFID (SPONGE) ×2 IMPLANT
SUT ETHILON 2 0 PS N (SUTURE) IMPLANT
SUT MNCRL AB 4-0 PS2 18 (SUTURE) ×2 IMPLANT
SUT PDS AB 0 CT 36 (SUTURE) ×4 IMPLANT
SUT STRAFIX PDS 18 CTX (SUTURE) IMPLANT
SUT STRATA PDS 0 15 CT-1.5 (SUTURE) IMPLANT
SUT VIC AB 2-0 SH 27X BRD (SUTURE) IMPLANT
SUT VIC AB 3-0 SH 8-18 (SUTURE) ×2 IMPLANT
SUT VICRYL 2 0 18 UND BR (SUTURE) ×4 IMPLANT
SUT VICRYL 2-0 SH 8X27 (SUTURE) ×2 IMPLANT
SUT VLOC 180 0 9IN GS21 (SUTURE) IMPLANT
SUTURE STRAFIX SYMMETRC 1-0 12 (SUTURE) IMPLANT
SUTURE STRAFIX SYMMETRC 1-0 24 (SUTURE) IMPLANT
SUTURE STRTFX SPRL PDS+ 2-0 23 (SUTURE) IMPLANT
SYR 20ML LL LF (SYRINGE) ×2 IMPLANT
TAPE STRIPS DRAPE STRL (GAUZE/BANDAGES/DRESSINGS) ×2 IMPLANT
TOWEL OR 17X26 10 PK STRL BLUE (TOWEL DISPOSABLE) ×2 IMPLANT
TOWEL ~~LOC~~+RFID 17X26 BLUE (SPONGE) ×2 IMPLANT
TRAY FOLEY MTR SLVR 14FR STAT (SET/KITS/TRAYS/PACK) IMPLANT
TRAY FOLEY MTR SLVR 16FR STAT (SET/KITS/TRAYS/PACK) IMPLANT
TROCAR ADV FIXATION 12X100MM (TROCAR) ×2 IMPLANT
TROCAR Z-THREAD FIOS 5X100MM (TROCAR) ×2 IMPLANT

## 2023-11-18 NOTE — Anesthesia Postprocedure Evaluation (Signed)
 Anesthesia Post Note  Patient: Carrie Arias  Procedure(s) Performed: REPAIR, HERNIA, VENTRAL, ROBOT-ASSISTED (Abdomen) REPAIR, HERNIA, EPIGASTRIC, ADULT (Abdomen)     Patient location during evaluation: PACU Anesthesia Type: General Level of consciousness: oriented, patient cooperative and sedated Pain management: pain level controlled Vital Signs Assessment: post-procedure vital signs reviewed and stable Respiratory status: spontaneous breathing, nonlabored ventilation and respiratory function stable Cardiovascular status: blood pressure returned to baseline and stable Postop Assessment: no apparent nausea or vomiting Anesthetic complications: no   No notable events documented.  Last Vitals:  Vitals:   11/18/23 1634 11/18/23 1746  BP: 132/86 137/81  Pulse: 81 67  Resp: 18 18  Temp: 36.9 C 36.7 C  SpO2: 100% 99%    Last Pain:  Vitals:   11/18/23 1657  TempSrc:   PainSc: 4                  Ramonia Mcclaran,E. Chaden Doom

## 2023-11-18 NOTE — Transfer of Care (Signed)
 Immediate Anesthesia Transfer of Care Note  Patient: Carrie Arias  Procedure(s) Performed: REPAIR, HERNIA, VENTRAL, ROBOT-ASSISTED (Abdomen) REPAIR, HERNIA, EPIGASTRIC, ADULT (Abdomen)  Patient Location: PACU  Anesthesia Type:General  Level of Consciousness: awake, drowsy, and patient cooperative  Airway & Oxygen Therapy: Patient Spontanous Breathing and Patient connected to face mask oxygen  Post-op Assessment: Report given to RN and Post -op Vital signs reviewed and stable  Post vital signs: Reviewed and stable  Last Vitals:  Vitals Value Taken Time  BP 149/93 11/18/23 15:22  Temp    Pulse 81 11/18/23 15:24  Resp 19 11/18/23 15:24  SpO2 100 % 11/18/23 15:24  Vitals shown include unfiled device data.  Last Pain:  Vitals:   11/18/23 0851  TempSrc:   PainSc: 0-No pain         Complications: No notable events documented.

## 2023-11-18 NOTE — Op Note (Signed)
 Patient: Carrie Arias ([birth year], [id number])  Date of Surgery: 11/18/2023  Preoperative Diagnosis:  Epigastric hernia (0.4 cm x 0.5cm based on preoperative CT)  Incisional hernia (6.8 cm wide by 14.2 cm tall based on preoperative CT)  Postoperative Diagnosis:  Epigastric hernia (0.4 cm x 0.5cm)  Incisional hernia (6.8 cm wide by 14.2 cm tall)   Surgical Procedure:  Robotic assisted ventral hernia repair with mesh 6.8 cm wide by 14.2 cm tall Robotic assisted epigastric hernia repair with mesh 0.4 cm x 0.5cm Bilateral posterior rectus myofascial release Bilateral transversus abdominis myofascial release  Operative Team Members:  Surgeons and Role:    * Sladen Plancarte, Deward PARAS, MD - Primary   Anesthesiologist: Leonce Athens, MD CRNA: Nada Corean CROME, CRNA; Williford, Peggy D, CRNA   Anesthesia: General   Fluids:  Total I/O In: 1300 [I.V.:1200; IV Piggyback:100] Out: 245 [Urine:235; Blood:10]  Complications: * No complications entered in OR log *  Drains:  None  Specimen: * No specimens in log *   Disposition:  PACU - hemodynamically stable.  Plan of Care: Admit for overnight observation  Indications for Procedure:  Ms. Shewmake has a 0.4 cm tall by 0.5 cm wide epigastric hernia that may be due to her previous laparoscopic port site for cholecystectomy.   She also has a umbilical and suprapubic incisional hernia within 10 cm of treatment listed as an incisional hernia with total hernia area measuring 6.8 cm wide by 14.2 cm tall.   I recommended robotic incisional and epigastric hernia repair with mesh, bilateral posterior rectus myofascial release, bilateral transversus abdominis myofascial release.  After full discussion all questions answered the patient granted consent to proceed.    Findings:  Hernia Location: Ventral hernia location: Epigastric (M2), Umbilical (M3), and Infraumbilical (M4) Hernia Size:   Epigastric hernia (0.4 cm x 0.5cm)  Incisional hernia  (6.8 cm wide by 14.2 cm tall)  Mesh Size &Type:  36 cm tall x 27 cm wide Bard Soft Mesh Mesh Position: [place]ubl[place] - Retromuscular Myofascial Releases: Bilateral posterior rectus myofascial release, Bilateral transversus abdominis myofascial release   Description of Procedure: The patient was positioned supine, moderately flexed at the umbilical level, padded and secured on the operating table.  A timeout procedure was performed.    What is described is a robotic, totally extraperitoneal retromuscular incisional hernia repair with bilateral rectus myofascial release, bilateral transversus abdominis myofascial release and retromuscular mesh placement.  Laparoscopic Portion: The retrorectus space was entered in the LEFT hypochondrium, at approximately the midclavicular line utilizing a 5 mm optical-viewing trocar.  Upon safe entry into this space, it was insufflated while performing a blunt dissection with the camera still in the optical trocar.   A rectus myofascial release was initiated laparoscopically on the LEFT side. Dissection was carried out laterally in the retromuscular plane to the edge of the rectus sheath progressively disconnecting the rectus muscle from the underlying posterior rectus sheath. Both the segmental innervation as well as the intercostal artery and vein brances to the rectus muscle were individually preserved.    During the left sided retrorectus dissection, a 12 mm trocar was placed into the lateral most edge of the retrorectus space.  With these initial trocars in position, the medial most aspect of the retrorectus plane was identified, and the posterior sheath was visualized as it inserted on the linea alba. The posterior sheath was incised with cautery entering the preperitoneal plane. A crossover was performed dissecting under the linea alba in the preperitoneal plane  until the right rectus sheath was identified.  After identification of the right rectus sheath, it was  incised vertically to enter the retrorectus space on the right.   A rectus myofascial release was initiated laparoscopically on the RIGHT side.  Blunt dissection was carried out laterally in the retromuscular plane to the edge of the rectus sheath progressively disconnecting the rectus muscle from the underlying posterior rectus sheath. Both the segmental innervation as well as the intercostal artery and vein brances to the rectus muscle were individually preserved.   At this juncture, both retrorectus planes were initially connected to each other and there was space for further trocar placement. An 8 mm robotic trocar was placed in the midclavicular line in right retrorectus space.  A 8mm robotic trocar was placed within the left rectus musculature in the upper abdomen, and not through the linea alba.  The initial 5 mm access trocar in the midclavicular line within the left retrorectus space was switched out for an 8 mm robotic trocar.   Robotic Portion: The Intuitive daVinci Xi surgical robot was docked in the standard fashion and the procedure begun from the robotic console. A Prograsp instrument and monopolar shears were used for the dissection.  The rectus myofascial release was completed robotically on the RIGHT side.  Dissection was carried down inferiorly preserving the peritoneum and the preperitoneal fat in the midline as it was gently dissected off of the overlying linea alba.  On the right side, the posterior rectus sheath was progressively disconnected from its insertion on the linea alba. This allowed for progression of the right side rectus myofascial release.  The rectus myofascial release accomplished medialization of the posterior rectus sheath towards the midline and disinsertion of the rectus muscle from its surrounding fascia, and thus its encasement in the rectus sheath, allowing for widening of the rectus muscle and transfer of the rectus flap towards the midline.  This will allow for  future inset of the medial aspect of the flap for abdominal wall reconstruction.  Similarly, the rectus myofascial release was completed robotically on the LEFT side.  Dissection of the posterior rectus sheath was also progressively disconnected from its insertion on the linea alba.  This allowed for progression of the left side rectus myofascial release.  The rectus myofascial release accomplished medialization of the posterior rectus sheath towards the midline and disinsertion of the rectus muscle from its surrounding fascia, and thus its encasement in the rectus sheath, allowing for widening of the rectus muscle and transfer of the rectus flap towards the midline.  This will allow for future inset of the medial aspect of the flap for abdominal wall reconstruction.  During the dissection of the midline the hernia defect was identified and the hernia sac was not reducible, therefore the hernia peritoneum was incised circumferentially around the edge of the hernia defect which left a defect within the peritoneum in the midline.  This defect was later closed with a running 2-0 Ethicon Stratafix Spiral PDS suture.  Both the left and the right rectus myofascial releases were performed towards the lower abdomen, past the arcuate line bilaterally.  During this dissection, the peritoneum and preperitoneal fat in the midline were further preserved below the hernia as they were dissected off of the overlying linea alba.   The hernia defect area was now visualized fully.  Tension on the peritoneal and posterior rectus fascia closure was assessed.  The goal was for a tension free closure to avoid postoperative intraparietal hernia, and  sufficient retromuscular mesh overlap.  To acchieve these goals, I decided bilateral transversus abdominis release was necessary.    A transversus abdominis release (TAR) was performed on the RIGHT side.  The transversus abdominis muscle was identified deep to the posterior rectus  sheath and incised vertically along its entire length, entering the pre-peritoneal or pre-transversalis fascia plane.  This disinserted the transversus abdominis muscle from the linea semilunaris.  Since the intercostal nerves, arteries and veins had been preserved during the rectus myofascial release portion of the procedure, they remained intact during the TAR. The peritoneum was subsequently peeled away from the underside of the divided transversus abdominis muscle.  This dissection was carried out laterally towards the retroperitoneum.  The TAR accomplished additional medialization of the posterior rectus sheath with its attached peritoneum towards the midline to allow for visceral sac closure.  The TAR also provided further offset of tension of the rectus muscle flap with additional transfer of the rectus muscle towards the midline, as it remained attached to the external and internal abdominal oblique muscles.  This will allow for future inset of the medial aspect of the flap for abdominal wall reconstruction.   A transversus abdominis release (TAR) was performed on the LEFT side.  The transversus abdominis muscle was identified deep to the posterior rectus sheath and incised vertically along its entire length, entering the pre-peritoneal or pre-transversalis fascia plane.  This disinserted the transversus abdominis muscle from the linea semilunaris.  Since the intercostal nerves, arteries and veins had been preserved during the rectus myofascial release portion of the procedure, they remained intact during the TAR. The peritoneum was subsequently peeled away from the underside of the divided transversus abdominis muscle.  This dissection was carried out laterally towards the retroperitoneum.  The TAR accomplished additional medialization of the posterior rectus sheath with its attached peritoneum towards the midline to allow for visceral sac closure.  The TAR also provided further offset of tension of the  rectus muscle flap with additional transfer of the rectus muscle towards the midline, as it remained attached to the external and internal abdominal oblique muscles.  This will allow for future inset of the medial aspect of the flap for abdominal wall reconstruction.   The hernia defect was closed utilizing a continuous, #1 Ethicon Stratafix Symmetric PDS Plus suture.  The hernia defect, and subsequently the rectus musculature, came together well for a complete abdominal wall reconstruction.  The dissected out retrorectus space was measured with a metric ruler so as to determine the size of the proposed mesh.    The robot was undocked and the laparoscope was inserted, inspecting for hemostasis.  The mesh deployment was performed laparoscopically.  Laparoscopic Portion:  A transversus abdominis plane (TAP) block was performed bilaterally.  The anesthetic was first injected into the plane between the transversus abdominis and internal abdominal oblique muscles on the left. The TAP was repeated on the contralateral side.   A piece of Bard Soft mesh was opened and trimmed to 36 cm tall x 27 cm wide. The mesh was advanced into the retrorectus space and the mesh positioned flat against the intact posterior rectus sheaths. The mesh was not fixated as it occupied the entire retromuscular plane, and also covered all of the trocars.  The trocars were removed and the skin closed with 4-0 Monocryl subcuticular sutures and skin glue.   Deward Foy, MD General, Bariatric, & Minimally Invasive Surgery [facility] Surgery, [place]

## 2023-11-18 NOTE — H&P (Signed)
 Admitting Physician: Carrie Arias  Service: General Surgery  CC: Ventral hernia  Subjective   HPI: Carrie Arias is an 43 y.o. female who is here for hernia repair  Past Medical History:  Diagnosis Date   Contraceptive management 07/07/2014   IUD (intrauterine device) in place 07/07/2014   Migraine headache    PONV (postoperative nausea and vomiting)    After cholecystectomy    Past Surgical History:  Procedure Laterality Date   BREAST SURGERY Left 1997   lump removed   CESAREAN SECTION  2005,2007   CHOLECYSTECTOMY      Family History  Problem Relation Age of Onset   Alzheimer's disease Maternal Grandmother    Heart attack Maternal Grandfather    Cancer Maternal Grandfather    Heart attack Paternal Grandmother    Cancer Paternal Grandfather        lung,brain    Social:  reports that she has never smoked. She has never used smokeless tobacco. She reports that she does not drink alcohol and does not use drugs.  Allergies: No Known Allergies  Medications: No current outpatient medications  ROS - all of the below systems have been reviewed with the patient and positives are indicated with bold text General: chills, fever or night sweats Eyes: blurry vision or double vision ENT: epistaxis or sore throat Allergy/Immunology: itchy/watery eyes or nasal congestion Hematologic/Lymphatic: bleeding problems, blood clots or swollen lymph nodes Endocrine: temperature intolerance or unexpected weight changes Breast: new or changing breast lumps or nipple discharge Resp: cough, shortness of breath, or wheezing CV: chest pain or dyspnea on exertion GI: as per HPI GU: dysuria, trouble voiding, or hematuria MSK: joint pain or joint stiffness Neuro: TIA or stroke symptoms Derm: pruritus and skin lesion changes Psych: anxiety and depression  Objective   PE Blood pressure (!) 165/93, pulse 90, temperature 98.5 F (36.9 C), temperature source Oral, resp. rate 16,  height 5' 3 (1.6 m), weight 87.8 kg, SpO2 99%. Constitutional: NAD; conversant; no deformities Eyes: Moist conjunctiva; no lid lag; anicteric; PERRL Neck: Trachea midline; no thyromegaly Lungs: Normal respiratory effort; no tactile fremitus CV: RRR; no palpable thrills; no pitting edema GI: Abd soft, nontender, infraumbilical hernia; no palpable hepatosplenomegaly MSK: Normal range of motion of extremities; no clubbing/cyanosis Psychiatric: Appropriate affect; alert and oriented x3 Lymphatic: No palpable cervical or axillary lymphadenopathy  No results found for this or any previous visit (from the past 24 hours).  Imaging Orders  No imaging studies ordered today   CT Abd/Pel 04/24/2022  1. No acute findings in the abdomen or pelvis. 2. 8-9 cm caudal to the umbilicus is a wide-mouth midline ventral hernia containing small bowel loops without complicating features. Fascial defect measures 6.8 by 4.5 cm. Hernia sac measures 5.1 x 8.1 x 14.7 cm. 3. Tiny umbilical hernia contains only fat.      Total hernia area 6.8 cm wide by 14.2 cm tall involving both suprapubic hernia and umbilical hernia    Separate epigastric hernia measures 0.4 cm tall by 0.5 cm wide  Assessment and Plan   Carrie Arias has a 0.4 cm tall by 0.5 cm wide epigastric hernia that may be due to her previous laparoscopic port site for cholecystectomy.   She also has a umbilical and suprapubic incisional hernia within 10 cm of treatment listed as an incisional hernia with total hernia area measuring 6.8 cm wide by 14.2 cm tall.   I recommended robotic incisional and epigastric hernia repair with mesh, bilateral  posterior rectus myofascial release, bilateral transversus abdominis myofascial release.  After full discussion all questions answered the patient granted consent to proceed.     ICD-10-CM   1. Pre-op testing  Z01.818 POCT Pregnancy, Urine per protocol    POCT Pregnancy, Urine per protocol       Carrie JINNY Foy, MD  [facility] Use [email or web address] to contact on call provider

## 2023-11-18 NOTE — Anesthesia Preprocedure Evaluation (Addendum)
 Anesthesia Evaluation  Patient identified by MRN, date of birth, ID band Patient awake    Reviewed: Allergy & Precautions, NPO status , Patient's Chart, lab work & pertinent test results  History of Anesthesia Complications (+) PONV  Airway Mallampati: II  TM Distance: >3 FB Neck ROM: Full    Dental  (+) Dental Advisory Given   Pulmonary neg pulmonary ROS   breath sounds clear to auscultation       Cardiovascular negative cardio ROS  Rhythm:Regular Rate:Normal     Neuro/Psych  Headaches  Anxiety        GI/Hepatic negative GI ROS, Neg liver ROS,,,  Endo/Other  BMI 34  Renal/GU negative Renal ROS     Musculoskeletal   Abdominal   Peds  Hematology Hb 13.9, plt 405   Anesthesia Other Findings   Reproductive/Obstetrics negative OB ROS IUD                              Anesthesia Physical Anesthesia Plan  ASA: 2  Anesthesia Plan: General   Post-op Pain Management: Tylenol  PO (pre-op)*   Induction:   PONV Risk Score and Plan: 4 or greater and Ondansetron , Dexamethasone  and Scopolamine  patch - Pre-op  Airway Management Planned: Oral ETT  Additional Equipment: None  Intra-op Plan:   Post-operative Plan: Extubation in OR  Informed Consent: I have reviewed the patients History and Physical, chart, labs and discussed the procedure including the risks, benefits and alternatives for the proposed anesthesia with the patient or authorized representative who has indicated his/her understanding and acceptance.     Dental advisory given  Plan Discussed with: CRNA and Surgeon  Anesthesia Plan Comments:          Anesthesia Quick Evaluation

## 2023-11-18 NOTE — Anesthesia Procedure Notes (Signed)
 Procedure Name: Intubation Date/Time: 11/18/2023 12:13 PM  Performed by: Nada Corean CROME, CRNAPre-anesthesia Checklist: Emergency Drugs available, Patient identified, Patient being monitored, Timeout performed and Suction available Patient Re-evaluated:Patient Re-evaluated prior to induction Oxygen Delivery Method: Circle system utilized Preoxygenation: Pre-oxygenation with 100% oxygen Induction Type: IV induction Ventilation: Mask ventilation without difficulty Laryngoscope Size: Mac and 3 Grade View: Grade I Tube type: Oral Tube size: 7.0 mm Number of attempts: 1 Airway Equipment and Method: Stylet Placement Confirmation: ETT inserted through vocal cords under direct vision, positive ETCO2 and breath sounds checked- equal and bilateral Secured at: 20 cm Tube secured with: Tape Dental Injury: Teeth and Oropharynx as per pre-operative assessment

## 2023-11-19 ENCOUNTER — Encounter (HOSPITAL_COMMUNITY): Payer: Self-pay | Admitting: Surgery

## 2023-11-19 DIAGNOSIS — K432 Incisional hernia without obstruction or gangrene: Secondary | ICD-10-CM | POA: Diagnosis not present

## 2023-11-19 LAB — BASIC METABOLIC PANEL WITH GFR
Anion gap: 7 (ref 5–15)
BUN: 12 mg/dL (ref 6–20)
CO2: 25 mmol/L (ref 22–32)
Calcium: 8.5 mg/dL — ABNORMAL LOW (ref 8.9–10.3)
Chloride: 103 mmol/L (ref 98–111)
Creatinine, Ser: 0.65 mg/dL (ref 0.44–1.00)
GFR, Estimated: >60 mL/min (ref >60)
Glucose, Bld: 133 mg/dL — ABNORMAL HIGH (ref 70–99)
Potassium: 3.8 mmol/L (ref 3.5–5.1)
Sodium: 135 mmol/L (ref 135–145)

## 2023-11-19 LAB — CBC
HCT: 36.7 % (ref 36.0–46.0)
Hemoglobin: 12.1 g/dL (ref 12.0–15.0)
MCH: 29.6 pg (ref 26.0–34.0)
MCHC: 33 g/dL (ref 30.0–36.0)
MCV: 89.7 fL (ref 80.0–100.0)
Platelets: 388 K/uL (ref 150–400)
RBC: 4.09 MIL/uL (ref 3.87–5.11)
RDW: 13.2 % (ref 11.5–15.5)
WBC: 12.6 K/uL — ABNORMAL HIGH (ref 4.0–10.5)
nRBC: 0 % (ref 0.0–0.2)

## 2023-11-19 MED ORDER — METHOCARBAMOL 500 MG PO TABS: 500.0000 mg | ORAL_TABLET | Freq: Four times a day (QID) | ORAL | 0 refills | Status: AC

## 2023-11-19 MED ORDER — OXYCODONE-ACETAMINOPHEN 5-325 MG PO TABS: 1.0000 | ORAL_TABLET | ORAL | 0 refills | Status: AC | PRN

## 2023-11-19 NOTE — Plan of Care (Signed)

## 2023-11-19 NOTE — Progress Notes (Signed)
   11/19/23 0846  TOC Brief Assessment  Insurance and Status Reviewed  Patient has primary care physician Yes  Home environment has been reviewed resides in private residence  Prior level of function: Independent  Prior/Current Home Services No current home services  Social Drivers of Health Review SDOH reviewed no interventions necessary  Readmission risk has been reviewed Yes

## 2023-11-19 NOTE — Discharge Summary (Signed)
  Patient ID: Carrie Arias [id number] 43 y.o. 1980-08-10  11/18/2023  Discharge date and time: 11/19/2023  Admitting Physician: Deward PARAS Haydin Dunn  Discharge Physician: Deward PARAS Kaeleb Emond  Admission Diagnoses: Incisional hernia, without obstruction or gangrene [K43.2] Ventral hernia [K43.9] Patient Active Problem List   Diagnosis Date Noted   Ventral hernia 11/18/2023   Anxiety 04/03/2021   Morbid obesity due to excess calories (HCC) 09/08/2015   Other specified abdominal hernia without obstruction or gangrene 09/08/2015   Abnormal mammogram 09/08/2015   IUD (intrauterine device) in place 07/07/2014   Vaginal itching 07/07/2014   Contraceptive management 07/07/2014     Discharge Diagnoses:  Patient Active Problem List   Diagnosis Date Noted   Ventral hernia 11/18/2023   Anxiety 04/03/2021   Morbid obesity due to excess calories (HCC) 09/08/2015   Other specified abdominal hernia without obstruction or gangrene 09/08/2015   Abnormal mammogram 09/08/2015   IUD (intrauterine device) in place 07/07/2014   Vaginal itching 07/07/2014   Contraceptive management 07/07/2014    Operations: Procedure(s): REPAIR, HERNIA, VENTRAL, ROBOT-ASSISTED REPAIR, HERNIA, EPIGASTRIC, ADULT  Admission Condition: good  Discharged Condition: good  Indication for Admission: Hernias  Hospital Course: Robotic hernia repair  Consults: None  Significant Diagnostic Studies: None  Treatments: Surgery as above  Disposition: Home  Patient Instructions:  Allergies as of 11/19/2023   No Known Allergies      Medication List     TAKE these medications    methocarbamol  500 MG tablet Commonly known as: ROBAXIN  Take 1 tablet (500 mg total) by mouth 4 (four) times daily.   oxyCODONE -acetaminophen  5-325 MG tablet Commonly known as: Percocet Take 1 tablet by mouth every 4 (four) hours as needed for severe pain (pain score 7-10).        Activity: no heavy lifting for 4 weeks Diet:  regular diet Wound Care: keep wound clean and dry  Follow-up:  With Dr. Lyndel  Signed: Deward PARAS Rickie Gutierres [facility] [place]   11/19/2023, 7:59 AM

## 2023-11-19 NOTE — Discharge Instructions (Signed)
